# Patient Record
Sex: Female | Born: 1980
Health system: Southern US, Community
[De-identification: ages and names within clinical notes are randomized; demographics above are authoritative.]

## PROBLEM LIST (undated history)

## (undated) DIAGNOSIS — Z5189 Encounter for other specified aftercare: Secondary | ICD-10-CM

## (undated) DIAGNOSIS — F32A Depression, unspecified: Secondary | ICD-10-CM

## (undated) DIAGNOSIS — B1920 Unspecified viral hepatitis C without hepatic coma: Secondary | ICD-10-CM

## (undated) DIAGNOSIS — F419 Anxiety disorder, unspecified: Secondary | ICD-10-CM

## (undated) DIAGNOSIS — F1910 Other psychoactive substance abuse, uncomplicated: Secondary | ICD-10-CM

## (undated) HISTORY — PX: EYE SURGERY: SHX253

## (undated) HISTORY — PX: FRACTURE SURGERY: SHX138

## (undated) HISTORY — DX: Anxiety disorder, unspecified: F41.9

## (undated) HISTORY — DX: Depression, unspecified: F32.A

## (undated) HISTORY — PX: TUBAL LIGATION: SHX77

## (undated) HISTORY — DX: Encounter for other specified aftercare: Z51.89

---
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
## 2002-02-28 ENCOUNTER — Other Ambulatory Visit: Admission: RE | Admit: 2002-02-28 | Discharge: 2002-02-28 | Payer: Self-pay | Admitting: *Deleted

## 2002-03-22 ENCOUNTER — Inpatient Hospital Stay (HOSPITAL_COMMUNITY): Admission: AD | Admit: 2002-03-22 | Discharge: 2002-03-22 | Payer: Self-pay | Admitting: *Deleted

## 2002-04-01 ENCOUNTER — Inpatient Hospital Stay (HOSPITAL_COMMUNITY): Admission: AD | Admit: 2002-04-01 | Discharge: 2002-04-01 | Payer: Self-pay | Admitting: *Deleted

## 2002-05-17 ENCOUNTER — Inpatient Hospital Stay (HOSPITAL_COMMUNITY): Admission: AD | Admit: 2002-05-17 | Discharge: 2002-05-19 | Payer: Self-pay | Admitting: *Deleted

## 2002-05-17 ENCOUNTER — Encounter: Payer: Self-pay | Admitting: *Deleted

## 2002-05-24 ENCOUNTER — Encounter: Payer: Self-pay | Admitting: *Deleted

## 2002-05-24 ENCOUNTER — Encounter (HOSPITAL_COMMUNITY): Admission: RE | Admit: 2002-05-24 | Discharge: 2002-05-24 | Payer: Self-pay | Admitting: *Deleted

## 2002-05-27 ENCOUNTER — Inpatient Hospital Stay (HOSPITAL_COMMUNITY): Admission: AD | Admit: 2002-05-27 | Discharge: 2002-05-31 | Payer: Self-pay | Admitting: *Deleted

## 2002-11-26 ENCOUNTER — Emergency Department (HOSPITAL_COMMUNITY): Admission: EM | Admit: 2002-11-26 | Discharge: 2002-11-26 | Payer: Self-pay | Admitting: Emergency Medicine

## 2003-08-21 ENCOUNTER — Emergency Department (HOSPITAL_COMMUNITY): Admission: EM | Admit: 2003-08-21 | Discharge: 2003-08-21 | Payer: Self-pay | Admitting: Emergency Medicine

## 2005-10-05 ENCOUNTER — Emergency Department: Payer: Self-pay | Admitting: Unknown Physician Specialty

## 2005-10-05 ENCOUNTER — Emergency Department: Payer: Self-pay | Admitting: Emergency Medicine

## 2005-10-21 ENCOUNTER — Emergency Department (HOSPITAL_COMMUNITY): Admission: EM | Admit: 2005-10-21 | Discharge: 2005-10-21 | Payer: Self-pay | Admitting: Emergency Medicine

## 2006-03-07 ENCOUNTER — Emergency Department (HOSPITAL_COMMUNITY): Admission: EM | Admit: 2006-03-07 | Discharge: 2006-03-07 | Payer: Self-pay | Admitting: Emergency Medicine

## 2006-04-12 ENCOUNTER — Emergency Department (HOSPITAL_COMMUNITY): Admission: EM | Admit: 2006-04-12 | Discharge: 2006-04-13 | Payer: Self-pay | Admitting: Emergency Medicine

## 2006-05-16 ENCOUNTER — Emergency Department (HOSPITAL_COMMUNITY): Admission: EM | Admit: 2006-05-16 | Discharge: 2006-05-16 | Payer: Self-pay | Admitting: Emergency Medicine

## 2006-05-25 ENCOUNTER — Ambulatory Visit (HOSPITAL_COMMUNITY): Admission: RE | Admit: 2006-05-25 | Discharge: 2006-05-25 | Payer: Self-pay | Admitting: Family Medicine

## 2006-07-05 ENCOUNTER — Ambulatory Visit (HOSPITAL_COMMUNITY): Admission: RE | Admit: 2006-07-05 | Discharge: 2006-07-05 | Payer: Self-pay | Admitting: Obstetrics & Gynecology

## 2006-07-22 ENCOUNTER — Ambulatory Visit (HOSPITAL_COMMUNITY): Admission: RE | Admit: 2006-07-22 | Discharge: 2006-07-22 | Payer: Self-pay | Admitting: Gynecology

## 2006-10-02 ENCOUNTER — Emergency Department (HOSPITAL_COMMUNITY): Admission: EM | Admit: 2006-10-02 | Discharge: 2006-10-02 | Payer: Self-pay | Admitting: Emergency Medicine

## 2006-11-14 ENCOUNTER — Inpatient Hospital Stay (HOSPITAL_COMMUNITY): Admission: AD | Admit: 2006-11-14 | Discharge: 2006-11-14 | Payer: Self-pay | Admitting: Family Medicine

## 2006-11-30 ENCOUNTER — Inpatient Hospital Stay (HOSPITAL_COMMUNITY): Admission: AD | Admit: 2006-11-30 | Discharge: 2006-11-30 | Payer: Self-pay | Admitting: Obstetrics

## 2006-12-05 ENCOUNTER — Inpatient Hospital Stay (HOSPITAL_COMMUNITY): Admission: RE | Admit: 2006-12-05 | Discharge: 2006-12-08 | Payer: Self-pay | Admitting: Obstetrics

## 2007-04-02 ENCOUNTER — Ambulatory Visit (HOSPITAL_COMMUNITY): Admission: AD | Admit: 2007-04-02 | Discharge: 2007-04-02 | Payer: Self-pay | Admitting: Obstetrics

## 2007-04-02 ENCOUNTER — Encounter (INDEPENDENT_AMBULATORY_CARE_PROVIDER_SITE_OTHER): Payer: Self-pay | Admitting: Obstetrics

## 2007-05-30 ENCOUNTER — Other Ambulatory Visit: Admission: RE | Admit: 2007-05-30 | Discharge: 2007-05-30 | Payer: Self-pay | Admitting: Unknown Physician Specialty

## 2007-05-30 ENCOUNTER — Encounter (INDEPENDENT_AMBULATORY_CARE_PROVIDER_SITE_OTHER): Payer: Self-pay | Admitting: Unknown Physician Specialty

## 2010-07-14 NOTE — Op Note (Signed)
NAMEERIEL, DUNCKEL NO.:  [id number]   MEDICAL RECORD NO.:  [id number]          PATIENT TYPE:  AMB   LOCATION:  SDC                           FACILITY:  [facility]   PHYSICIAN:  Kathreen Cosier, M.D.DATE OF BIRTH:  01-Jan-1981   DATE OF PROCEDURE:  04/02/2007  DATE OF DISCHARGE:                               OPERATIVE REPORT   PREOPERATIVE DIAGNOSIS:  Spontaneous incomplete abortion.   POSTOPERATIVE DIAGNOSIS:  Spontaneous incomplete abortion.   SURGEON:  Kathreen Cosier, M.D.   DESCRIPTION OF PROCEDURE:  Using MAC, patient in lithotomy position,  perineum and vagina were prepped and draped and bladder emptied with a  straight catheter.  Bimanual exam revealed uterus to be 8 weeks' size.  Speculum was placed in the vagina.  Cervix was injected with 10 mL of 1%  Xylocaine.  The cervix was noted to be open and easily admitted a #29  Pratt.  A #9 suction was used to aspirate the uterine contents until the  cavity was clean.  The patient tolerated the procedure well and taken to  recovery room in good condition.           ______________________________  Kathreen Cosier, M.D.     BAM/MEDQ  D:  04/02/2007  T:  04/03/2007  Job:  [id number]

## 2010-07-17 NOTE — Discharge Summary (Signed)
   NAME:  Abigail Rosario, Abigail Rosario                       ACCOUNT NO.:  [id number]   MEDICAL RECORD NO.:  [id number]                   PATIENT TYPE:  INP   LOCATION:  9163                                 FACILITY:  [facility]   PHYSICIAN:  Georgina Peer, M.D.              DATE OF BIRTH:  [birth year]   DATE OF ADMISSION:  05/17/2002  DATE OF DISCHARGE:  05/19/2002                                 DISCHARGE SUMMARY   ADMISSION DIAGNOSES:  Pregnancy at 12 and 3/7 weeks with mild  oligohydramnios and decreased fetal movement.   DISCHARGE DIAGNOSES:  1. Pregnancy 40 and 5/7 weeks.  2. Reassuring fetal heart rate tracing.  3. Failed induction.   BRIEF HISTORY:  This is a 30 year old gravida 1, para 0, EDC May 14, 2002  presented at 40 and 3/7 weeks for evaluation of decreased fetal movement for  two days.  The patient had a BPP of 8/8, but an AFI of 8.8 which was  slightly decreased and she was brought in for attempted induction.  The  ultrasound showed appropriate for gestational age and she underwent Cytotec  cervical ripening.  At that point her cervix was 1, 3 cm long, and -2  station and she was contracting every three to four minutes.  Pitocin was  continued through the day on March 19 but it was discontinued at  approximately 2000 hours with the cervix unchanged.  She was given two more  doses of Cytotec through the night and on March 20 a full day of Pitocin.  At 1800 the patient was feeling only mild contractions with regularity.  The  fetal heart rate was reassuring without evidence of decelerations.  The  patient was given the option to continue with a third day of induction or to  go home because the fetal heart rate tracing had been reactive for two days.  The patient chose to go home and was discharged in good condition.  She will  follow up in the office in two to three days.  She will note any decreased  fetal movement, ruptured membranes, or labor.                   Georgina Peer, M.D.    JPN/MEDQ  D:  05/28/2002  T:  05/29/2002  Job:  [id number]

## 2010-11-20 LAB — POCT PREGNANCY, URINE
Operator id: 263291
Preg Test, Ur: POSITIVE

## 2010-11-20 LAB — TYPE AND SCREEN: Antibody Screen: NEGATIVE

## 2010-11-20 LAB — CBC
Hemoglobin: 12.6
MCHC: 34.8
MCV: 80.3
Platelets: 190
RBC: 4.51
RDW: 15.8 — ABNORMAL HIGH
WBC: 7.6

## 2010-12-10 LAB — CBC
HCT: 35.8 — ABNORMAL LOW
Hemoglobin: 12.6
MCV: 87.1
RBC: 3.18 — ABNORMAL LOW
RBC: 4.11
WBC: 8.4

## 2010-12-10 LAB — URINALYSIS, ROUTINE W REFLEX MICROSCOPIC
Bilirubin Urine: NEGATIVE
Bilirubin Urine: NEGATIVE
Ketones, ur: NEGATIVE
Nitrite: NEGATIVE
Protein, ur: NEGATIVE
Protein, ur: 30 — AB
Specific Gravity, Urine: 1.02
Specific Gravity, Urine: 1.01
Urobilinogen, UA: 0.2
Urobilinogen, UA: 0.2
pH: 7

## 2010-12-10 LAB — URINE MICROSCOPIC-ADD ON

## 2017-02-07 DIAGNOSIS — R748 Abnormal levels of other serum enzymes: Secondary | ICD-10-CM | POA: Insufficient documentation

## 2017-02-07 DIAGNOSIS — R17 Unspecified jaundice: Secondary | ICD-10-CM | POA: Insufficient documentation

## 2017-02-07 DIAGNOSIS — R8290 Unspecified abnormal findings in urine: Secondary | ICD-10-CM | POA: Insufficient documentation

## 2017-02-07 DIAGNOSIS — F32A Depression, unspecified: Secondary | ICD-10-CM | POA: Insufficient documentation

## 2017-02-07 DIAGNOSIS — R7402 Elevation of levels of lactic acid dehydrogenase (LDH): Secondary | ICD-10-CM | POA: Insufficient documentation

## 2017-02-07 DIAGNOSIS — R1013 Epigastric pain: Secondary | ICD-10-CM | POA: Insufficient documentation

## 2017-02-07 DIAGNOSIS — K810 Acute cholecystitis: Secondary | ICD-10-CM | POA: Insufficient documentation

## 2017-02-07 DIAGNOSIS — K8000 Calculus of gallbladder with acute cholecystitis without obstruction: Secondary | ICD-10-CM | POA: Insufficient documentation

## 2017-02-10 DIAGNOSIS — R7881 Bacteremia: Secondary | ICD-10-CM | POA: Insufficient documentation

## 2020-01-08 ENCOUNTER — Emergency Department (HOSPITAL_COMMUNITY): Payer: Self-pay

## 2020-01-08 ENCOUNTER — Emergency Department (HOSPITAL_COMMUNITY)
Admission: EM | Admit: 2020-01-08 | Discharge: 2020-01-08 | Disposition: A | Discharge status: Other Acute Inpatient Hospital | Payer: Self-pay | Attending: Emergency Medicine | Admitting: Emergency Medicine

## 2020-01-08 ENCOUNTER — Encounter (HOSPITAL_COMMUNITY): Payer: Self-pay | Admitting: *Deleted

## 2020-01-08 DIAGNOSIS — Z046 Encounter for general psychiatric examination, requested by authority: Secondary | ICD-10-CM | POA: Insufficient documentation

## 2020-01-08 DIAGNOSIS — Z20822 Contact with and (suspected) exposure to covid-19: Secondary | ICD-10-CM | POA: Insufficient documentation

## 2020-01-08 DIAGNOSIS — F17200 Nicotine dependence, unspecified, uncomplicated: Secondary | ICD-10-CM | POA: Insufficient documentation

## 2020-01-08 DIAGNOSIS — W260XXA Contact with knife, initial encounter: Secondary | ICD-10-CM | POA: Insufficient documentation

## 2020-01-08 DIAGNOSIS — Z23 Encounter for immunization: Secondary | ICD-10-CM | POA: Insufficient documentation

## 2020-01-08 DIAGNOSIS — S41112A Laceration without foreign body of left upper arm, initial encounter: Secondary | ICD-10-CM | POA: Insufficient documentation

## 2020-01-08 HISTORY — DX: Other psychoactive substance abuse, uncomplicated: F19.10

## 2020-01-08 HISTORY — DX: Unspecified viral hepatitis C without hepatic coma: B19.20

## 2020-01-08 LAB — COMPREHENSIVE METABOLIC PANEL
ALT: 17 U/L (ref 0–44)
AST: 21 U/L (ref 15–41)
Albumin: 4.6 g/dL (ref 3.5–5.0)
Alkaline Phosphatase: 68 U/L (ref 38–126)
Anion gap: 8 (ref 5–15)
BUN: 10 mg/dL (ref 6–20)
CO2: 24 mmol/L (ref 22–32)
Calcium: 9.2 mg/dL (ref 8.9–10.3)
Chloride: 105 mmol/L (ref 98–111)
Creatinine, Ser: 0.69 mg/dL (ref 0.44–1.00)
GFR, Estimated: >60 mL/min (ref >60)
Glucose, Bld: 95 mg/dL (ref 70–99)
Potassium: 3.3 mmol/L — ABNORMAL LOW (ref 3.5–5.1)
Sodium: 137 mmol/L (ref 135–145)
Total Bilirubin: 0.6 mg/dL (ref 0.3–1.2)
Total Protein: 7.6 g/dL (ref 6.5–8.1)

## 2020-01-08 LAB — RAPID URINE DRUG SCREEN, HOSP PERFORMED
Amphetamines: POSITIVE — AB
Barbiturates: NOT DETECTED
Benzodiazepines: NOT DETECTED
Cocaine: NOT DETECTED
Opiates: NOT DETECTED
Tetrahydrocannabinol: POSITIVE — AB

## 2020-01-08 LAB — RESPIRATORY PANEL BY RT PCR (FLU A&B, COVID)
Influenza A by PCR: NEGATIVE
Influenza B by PCR: NEGATIVE
SARS Coronavirus 2 by RT PCR: NEGATIVE

## 2020-01-08 LAB — CBC
HCT: 43 % (ref 36.0–46.0)
Hemoglobin: 13.7 g/dL (ref 12.0–15.0)
MCH: 26.4 pg (ref 26.0–34.0)
MCHC: 31.9 g/dL (ref 30.0–36.0)
MCV: 82.9 fL (ref 80.0–100.0)
Platelets: 242 10*3/uL (ref 150–400)
RBC: 5.19 MIL/uL — ABNORMAL HIGH (ref 3.87–5.11)
RDW: 14.1 % (ref 11.5–15.5)
WBC: 8.6 10*3/uL (ref 4.0–10.5)
nRBC: 0 % (ref 0.0–0.2)

## 2020-01-08 LAB — ETHANOL: Alcohol, Ethyl (B): <10 mg/dL (ref <10)

## 2020-01-08 LAB — I-STAT BETA HCG BLOOD, ED (MC, WL, AP ONLY): I-stat hCG, quantitative: <5 m[IU]/mL (ref <5)

## 2020-01-08 LAB — ACETAMINOPHEN LEVEL: Acetaminophen (Tylenol), Serum: <10 ug/mL — ABNORMAL LOW (ref 10–30)

## 2020-01-08 LAB — SALICYLATE LEVEL: Salicylate Lvl: <7 mg/dL — ABNORMAL LOW (ref 7.0–30.0)

## 2020-01-08 MED ORDER — NICOTINE 21 MG/24HR TD PT24
21.0000 mg | MEDICATED_PATCH | Freq: Every day | TRANSDERMAL | Status: DC
  Administered 2020-01-08: 21 mg via TRANSDERMAL
  Filled 2020-01-08: qty 1

## 2020-01-08 MED ORDER — POTASSIUM CHLORIDE CRYS ER 20 MEQ PO TBCR: 40.0000 meq | EXTENDED_RELEASE_TABLET | Freq: Once | ORAL | Status: DC

## 2020-01-08 MED ORDER — TETANUS-DIPHTH-ACELL PERTUSSIS 5-2.5-18.5 LF-MCG/0.5 IM SUSY
0.5000 mL | PREFILLED_SYRINGE | Freq: Once | INTRAMUSCULAR | Status: AC
  Administered 2020-01-08: 0.5 mL via INTRAMUSCULAR
  Filled 2020-01-08: qty 0.5

## 2020-01-08 MED ORDER — ACETAMINOPHEN 500 MG PO TABS
1000.0000 mg | ORAL_TABLET | Freq: Once | ORAL | Status: AC
  Administered 2020-01-08: 1000 mg via ORAL
  Filled 2020-01-08: qty 2

## 2020-01-08 MED ORDER — ONDANSETRON 4 MG PO TBDP
4.0000 mg | ORAL_TABLET | Freq: Once | ORAL | Status: AC
  Administered 2020-01-08: 4 mg via ORAL
  Filled 2020-01-08: qty 1

## 2020-01-08 NOTE — ED Provider Notes (Signed)
[facility]-EMERGENCY DEPT Provider Note   CSN: [id number] Arrival date & time: 01/08/20  1432     History Chief Complaint  Patient presents with  . Medical Clearance    Abigail RuddyJennifer Rosario is a 39 y.o. female with a past medical history of drug abuse including methamphetamines, hepatitis C, who presents today for evaluation of reported self-inflicted laceration.  History obtained from patient and triage notes. According to triage note patient was [facility] by [facility] stating that she cut her self with a razor.  She reports meth and cannabis use.  She does not know when her last tetanus shot was.  She states that she feels hopeless.  She states that she has issues with her relationship and depression however not elaborate.  At first she tells me she does not know how she got cut.  When I continue to ask her she tells me "I don't know, it was a mirror or something I broke at home and snagged my arm on it."  She reports that she thinks abut SI however "wouldn't act on it."    She denies HI or AVH.   HPI     Past Medical History:  Diagnosis Date  . Drug abuse (HCC)   . Hepatitis C     There are no problems to display for this patient.   History reviewed. No pertinent surgical history.   OB History   No obstetric history on file.     No family history on file.  Social History   Tobacco Use  . Smoking status: Current Every Day Smoker  . Smokeless tobacco: Never Used  Substance Use Topics  . Alcohol use: Not on file  . Drug use: Yes    Types: Methamphetamines    Home Medications Prior to Admission medications   Medication Sig Start Date End Date Taking? Authorizing Provider  ibuprofen (ADVIL) 200 MG tablet Take 600 mg by mouth every 6 (six) hours as needed for fever, headache or mild pain.   Yes [provider]  lidocaine (LIDODERM) 5 % Place 1 patch onto the skin daily as needed (pain). Remove  & Discard patch within 12 hours or as directed by MD   Yes [provider]    Allergies    Septra [sulfamethoxazole-trimethoprim]  Review of Systems   Review of Systems  Unable to perform ROS: Psychiatric disorder   Patient is choosing not to answer many direct questions.  Physical Exam Updated Vital Signs BP 108/60 (BP Location: Right Arm)   Pulse 85   Temp (!) 97.5 F (36.4 C) (Oral)   Resp 17   SpO2 100%   Physical Exam Vitals and nursing note reviewed.  Constitutional:      Appearance: She is well-developed. She is not diaphoretic.     Comments: Disheveled, tearful  HENT:     Head: Normocephalic and atraumatic.  Eyes:     General: No scleral icterus.       Right eye: No discharge.        Left eye: No discharge.     Conjunctiva/sclera: Conjunctivae normal.  Cardiovascular:     Rate and Rhythm: Normal rate and regular rhythm.     Pulses: Normal pulses.  Pulmonary:     Effort: Pulmonary effort is normal. No respiratory distress.     Breath sounds: Normal breath sounds. No stridor.  Abdominal:     General: There is no distension.  Tenderness: There is no abdominal tenderness.  Musculoskeletal:        General: No deformity.     Cervical back: Normal range of motion.     Comments: Full AROM of left arm and hand.   Skin:    General: Skin is warm and dry.     Comments: Please see clinical image.  There is a 5cm laceration across the left anterior superior forearm across the tattoo of name.  Wound is partially through the dermis without significant subcutaneous involvement.   Neurological:     General: No focal deficit present.     Mental Status: She is alert and oriented to person, place, and time.     Sensory: No sensory deficit (Sensation intact to light touch to left arm/hand).     Motor: No abnormal muscle tone.  Psychiatric:        Attention and Perception: She is inattentive.        Mood and Affect: Affect is labile and tearful.        Speech:  Speech is tangential.        Behavior: Behavior is uncooperative, agitated and withdrawn.        Thought Content: Thought content does not include homicidal ideation. Thought content does not include homicidal plan.        Judgment: Judgment is impulsive.     Comments: Does not appear to be responding to internal stimuli.         ED Results / Procedures / Treatments   Labs (all labs ordered are listed, but only abnormal results are displayed) Labs Reviewed  COMPREHENSIVE METABOLIC PANEL - Abnormal; Notable for the following components:      Result Value   Potassium 3.3 (*)    All other components within normal limits  SALICYLATE LEVEL - Abnormal; Notable for the following components:   Salicylate Lvl <7.0 (*)    All other components within normal limits  ACETAMINOPHEN LEVEL - Abnormal; Notable for the following components:   Acetaminophen (Tylenol), Serum <10 (*)    All other components within normal limits  CBC - Abnormal; Notable for the following components:   RBC 5.19 (*)    All other components within normal limits  RAPID URINE DRUG SCREEN, HOSP PERFORMED - Abnormal; Notable for the following components:   Amphetamines POSITIVE (*)    Tetrahydrocannabinol POSITIVE (*)    All other components within normal limits  RESPIRATORY PANEL BY RT PCR (FLU A&B, COVID)  ETHANOL  I-STAT BETA HCG BLOOD, ED ([facility], [facility], [facility] ONLY)    EKG None  Radiology DG Chest Portable 1 View  Result Date: 01/08/2020 CLINICAL DATA:  Cough EXAM: PORTABLE CHEST 1 VIEW COMPARISON:  08/14/2011 FINDINGS: The heart size and mediastinal contours are within normal limits. Both lungs are clear. The visualized skeletal structures are unremarkable. IMPRESSION: No active disease. Electronically Signed   By: Helyn Numbers MD   On: 01/08/2020 16:21    Procedures .Marland KitchenLaceration Repair  Date/Time: 01/08/2020 10:33 PM Performed by: Cristina Gong, PA-C Authorized by: Cristina Gong, PA-C   Consent:     Consent obtained:  Verbal (Patient IVC)   Consent given by:  Patient   Risks discussed:  Infection, need for additional repair, poor cosmetic result, pain, retained foreign body, tendon damage, vascular damage, poor wound healing and nerve damage   Alternatives discussed:  No treatment and referral (Alternative wound closures) Anesthesia (see MAR for exact dosages):    Anesthesia method:  None Laceration  details:    Length (cm):  5 Repair type:    Repair type:  Simple Exploration:    Hemostasis achieved with:  Direct pressure   Wound exploration: wound explored through full range of motion and entire depth of wound probed and visualized     Wound extent: no muscle damage noted and no vascular damage noted   Treatment:    Irrigation method:  Pressure wash Skin repair:    Repair method:  Steri-Strips and tissue adhesive   Number of Steri-Strips:  3 Approximation:    Approximation:  Close Post-procedure details:    Dressing:  Non-adherent dressing   Patient tolerance of procedure:  Tolerated well, no immediate complications Comments:     Patient adamantly refused sutures stating that she did not want stitches, she wanted to be either glue or a butterfly bandage.    (including critical care time)  Medications Ordered in ED Medications  nicotine (NICODERM CQ - dosed in mg/24 hours) patch 21 mg (21 mg Transdermal Patch Applied 01/08/20 1707)  Tdap (BOOSTRIX) injection 0.5 mL (0.5 mLs Intramuscular Given 01/08/20 1707)  ondansetron (ZOFRAN-ODT) disintegrating tablet 4 mg (4 mg Oral Given 01/08/20 2200)  acetaminophen (TYLENOL) tablet 1,000 mg (1,000 mg Oral Given 01/08/20 2200)    ED Course  I have reviewed the triage vital signs and the nursing notes.  Pertinent labs & imaging results that were available during my care of the patient were reviewed by me and considered in my medical decision making (see chart for details).  Clinical Course as of Jan 07 2234  Tue Jan 08, 2020   1527 Informed by RN that patient is refusing blood draws, not being cooperative, will not leave door open to allow for adequate safety supervision. Will attempt IVC papers.     [EH]  1622 IVC papers signed by Dr. Freida Busman, given to secretary.    [EH]    Clinical Course User Index [EH] Norman Clay   MDM Rules/Calculators/A&P                         Patient is a 39 year old woman who presents today for evaluation of an arm laceration.  According to triage note patient had a self-inflicted laceration on the arm.  Initially patient tells me she does not know how she got the wound however then tells me she caught it on a broken mirror.  And concern for a self-inflicted laceration especially as the laceration runs through a tattoo she has of a person's name.  Patient would not answer all questions directly limiting ability to evaluate her and was uncooperative while in the emergency room.  Due to this and concern for self-inflicted laceration IVC paperwork is completed as patient appears to be a threat to herself.  Labs are obtained and reviewed, acetaminophen, salicylate and ethanol are undetected.  Covid is negative.  CBC and CMP without significant acute abnormalities.  UDS is positive for amphetamines and THC, both of which patient admitted to using during our discussion.  She refused sutures, stating that she only wanted the wound closed with adhesive or bandages.  Given that the wound is superficial and does not gape significantly I feel this is appropriate.  Wound is closed and approximated with Steri-Strips and tissue adhesive after irrigation.    Patient did report mild headache and is given Tylenol.  Her tetanus is updated.  She is given a nicotine patch.  TTS is consulted.  Patient is medically  clear for psychiatric evaluation and disposition.  Note: Portions of this report may have been transcribed using voice recognition software. Every effort was made to ensure accuracy;  however, inadvertent computerized transcription errors may be present  Final Clinical Impression(s) / ED Diagnoses Final diagnoses:  Involuntary commitment  Laceration of left upper arm without complication, initial encounter    Rx / DC Orders ED Discharge Orders    None       Norman Clay 01/08/20 2240    Lorre Nick, MD 01/10/20 [phone]

## 2020-01-08 NOTE — ED Notes (Signed)
TTS called and states patient has been accepted at [facility] for room 304 Bed 2.   Call report to [phone]

## 2020-01-08 NOTE — ED Triage Notes (Addendum)
BIB EMS per [facility] boyfriend called stating she cut self with razor, approx 3 in cut to left forearm, bleeding controlled. Wants drug rehab ? meth use. Refused vitals with EMS. Pt not wanting to talk.   Pt states, "I just want to talk to somebody" later she begins to ramble, will not answer questions, "I want something to drink.  Difficult to complete triage due to rambling.
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
## 2020-01-08 NOTE — ED Provider Notes (Signed)
Medical screening examination/treatment/procedure(s) were conducted as a shared visit with non-physician practitioner(s) and myself.  I personally evaluated the patient during the encounter.    39 year old female who presents after self-inflicted wounds to her wrist.  History of substance abuse.  Not cooperative.  Placed under IVC for her safety.  Will consult TTS pending medical clearance   Lorre Nick, MD 01/08/20 1651

## 2020-01-08 NOTE — ED Notes (Signed)
Pt pulled tourniquet off while trying to get blood drawn.  Pt then stated after this incident that she didn't want her labs drawn.

## 2020-01-08 NOTE — ED Notes (Signed)
[facility] called for transportation to [facility]

## 2020-01-09 ENCOUNTER — Other Ambulatory Visit: Payer: Self-pay

## 2020-01-09 ENCOUNTER — Inpatient Hospital Stay (HOSPITAL_COMMUNITY)
Admission: AD | Admit: 2020-01-09 | Discharge: 2020-01-11 | DRG: 885 | Disposition: A | Discharge status: Home / Self Care | Payer: Federal, State, Local not specified - Other | Source: Intra-hospital | Attending: Psychiatry | Admitting: Psychiatry

## 2020-01-09 ENCOUNTER — Encounter (HOSPITAL_COMMUNITY): Payer: Self-pay | Admitting: Psychiatry

## 2020-01-09 DIAGNOSIS — F17210 Nicotine dependence, cigarettes, uncomplicated: Secondary | ICD-10-CM | POA: Diagnosis present

## 2020-01-09 DIAGNOSIS — Z9152 Personal history of nonsuicidal self-harm: Secondary | ICD-10-CM | POA: Diagnosis not present

## 2020-01-09 DIAGNOSIS — Z818 Family history of other mental and behavioral disorders: Secondary | ICD-10-CM

## 2020-01-09 DIAGNOSIS — F419 Anxiety disorder, unspecified: Secondary | ICD-10-CM | POA: Diagnosis present

## 2020-01-09 DIAGNOSIS — F322 Major depressive disorder, single episode, severe without psychotic features: Secondary | ICD-10-CM | POA: Diagnosis present

## 2020-01-09 DIAGNOSIS — F329 Major depressive disorder, single episode, unspecified: Secondary | ICD-10-CM | POA: Diagnosis present

## 2020-01-09 DIAGNOSIS — F1990 Other psychoactive substance use, unspecified, uncomplicated: Secondary | ICD-10-CM

## 2020-01-09 DIAGNOSIS — F332 Major depressive disorder, recurrent severe without psychotic features: Secondary | ICD-10-CM

## 2020-01-09 MED ORDER — ACETAMINOPHEN 325 MG PO TABS
650.0000 mg | ORAL_TABLET | Freq: Four times a day (QID) | ORAL | Status: DC | PRN
  Administered 2020-01-09: 650 mg via ORAL
  Filled 2020-01-09: qty 2

## 2020-01-09 MED ORDER — QUETIAPINE FUMARATE 50 MG PO TABS
50.0000 mg | ORAL_TABLET | Freq: Every day | ORAL | Status: DC
  Administered 2020-01-09 – 2020-01-10 (×2): 50 mg via ORAL
  Filled 2020-01-09 (×3): qty 1

## 2020-01-09 MED ORDER — GABAPENTIN 100 MG PO CAPS
200.0000 mg | ORAL_CAPSULE | Freq: Three times a day (TID) | ORAL | Status: DC
  Administered 2020-01-09 – 2020-01-11 (×5): 200 mg via ORAL
  Filled 2020-01-09 (×10): qty 2

## 2020-01-09 MED ORDER — ESCITALOPRAM OXALATE 10 MG PO TABS
10.0000 mg | ORAL_TABLET | Freq: Every day | ORAL | Status: DC
  Administered 2020-01-09 – 2020-01-11 (×3): 10 mg via ORAL
  Filled 2020-01-09 (×5): qty 1

## 2020-01-09 MED ORDER — MAGNESIUM HYDROXIDE 400 MG/5ML PO SUSP
30.0000 mL | Freq: Every day | ORAL | Status: DC | PRN
  Administered 2020-01-09: 30 mL via ORAL
  Filled 2020-01-09: qty 30

## 2020-01-09 MED ORDER — NICOTINE 21 MG/24HR TD PT24
21.0000 mg | MEDICATED_PATCH | Freq: Every day | TRANSDERMAL | Status: DC
  Administered 2020-01-09 – 2020-01-10 (×2): 21 mg via TRANSDERMAL
  Filled 2020-01-09 (×4): qty 1

## 2020-01-09 MED ORDER — WHITE PETROLATUM EX OINT
TOPICAL_OINTMENT | CUTANEOUS | Status: AC
  Administered 2020-01-09: 1
  Filled 2020-01-09: qty 5

## 2020-01-09 MED ORDER — TRAZODONE HCL 50 MG PO TABS: 50.0000 mg | ORAL_TABLET | Freq: Every evening | ORAL | Status: DC | PRN

## 2020-01-09 MED ORDER — HYDROXYZINE HCL 25 MG PO TABS
25.0000 mg | ORAL_TABLET | Freq: Three times a day (TID) | ORAL | Status: DC | PRN
  Administered 2020-01-09: 25 mg via ORAL
  Filled 2020-01-09: qty 1

## 2020-01-09 MED ORDER — ALUM & MAG HYDROXIDE-SIMETH 200-200-20 MG/5ML PO SUSP: 30.0000 mL | ORAL | Status: DC | PRN

## 2020-01-09 NOTE — BHH Counselor (Signed)
The focus of this group is to help patients establish daily goals to achieve during treatment and discuss how the patient can incorporate goal setting into their daily lives to aide in recovery.   Patient did not attend group. 

## 2020-01-09 NOTE — Tx Team (Signed)
Initial Treatment Plan 01/09/2020 3:54 AM [name] DYJ:[id number]    PATIENT STRESSORS: Marital or family conflict Medication change or noncompliance   PATIENT STRENGTHS: General fund of knowledge Motivation for treatment/growth   PATIENT IDENTIFIED PROBLEMS: Risk for suicide  depression  anxiety  "anxiety, depression, be able to talk more"               DISCHARGE CRITERIA:  Improved stabilization in mood, thinking, and/or behavior Verbal commitment to aftercare and medication compliance  PRELIMINARY DISCHARGE PLAN: Attend PHP/IOP Outpatient therapy  PATIENT/FAMILY INVOLVEMENT: This treatment plan has been presented to and reviewed with the patient, Abigail Rosario.  The patient and family have been given the opportunity to ask questions and make suggestions.  Delos Haring, RN 01/09/2020, 3:54 AM

## 2020-01-09 NOTE — BHH Group Notes (Addendum)
BHH LCSW Group Therapy  01/09/2020 2:15 PM  Type of Therapy:  Gratitude and Thankfulness   Participation Level:  Minimal  Participation Quality:  Drowsy  Affect:  Depressed and Lethargic  Cognitive:  Appropriate  Insight:  Developing/Improving  Engagement in Therapy:  Developing/Improving  Modes of Intervention:  Activity and Discussion  Summary of Progress/Problems: Abigail Rosario attended group but came back early due to being tired and wanting to sleep.  While outside in group Abigail Rosario did not share what she is thankful for and sat in group alone.    Abigail Rosario 01/09/2020, 2:15 PM

## 2020-01-09 NOTE — BHH Suicide Risk Assessment (Signed)
[facility] Admission Suicide Risk Assessment   Nursing information obtained from:  Patient Demographic factors:  Low socioeconomic status Current Mental Status:  NA Loss Factors:  Financial problems / change in socioeconomic status Historical Factors:  Impulsivity Risk Reduction Factors:  Positive social support  Total Time spent with patient: 15 minutes Principal Problem: <principal problem not specified> Diagnosis:  Active Problems:   MDD (major depressive disorder)  Data: 39 year old female with history of depression and polysusbstance abuse presenting with SI  Continued Clinical Symptoms:  Alcohol Use Disorder Identification Test Final Score (AUDIT): 1 The "Alcohol Use Disorders Identification Test", Guidelines for Use in Primary Care, Second Edition.  World Science writer [facility]). Score between 0-7:  no or low risk or alcohol related problems. Score between 8-15:  moderate risk of alcohol related problems. Score between 16-19:  high risk of alcohol related problems. Score 20 or above:  warrants further diagnostic evaluation for alcohol dependence and treatment.   CLINICAL FACTORS:   Depression:   Impulsivity Alcohol/Substance Abuse/Dependencies  See HPI for Mental status exam  COGNITIVE FEATURES THAT CONTRIBUTE TO RISK:  Closed-mindedness    SUICIDE RISK:   Mild:  Suicidal ideation of limited frequency, intensity, duration, and specificity.  There are no identifiable plans, no associated intent, mild dysphoria and related symptoms, good self-control (both objective and subjective assessment), few other risk factors, and identifiable protective factors, including available and accessible social support.  PLAN OF CARE: Continue care on inpatient basis  I certify that inpatient services furnished can reasonably be expected to improve the patient's condition.   Clement Sayres, MD 01/09/2020, 4:36 PM

## 2020-01-09 NOTE — Progress Notes (Signed)
D:  Patient's self inventory sheet, patient sleeps good, sleep medication helpful.  Poor appetite, low energy level, poor concentration.  Rated depression and anxiety 8, hopeless 5.  Withdrawals, sedation, chilling, cravings, agitation, nausea.  Physical problems, lightheaded, pain, heart, head, body pain#6,.  Goal is rest.  No plans to discharge at this time. A:  Medications administered per MD orders.  Emotional support and encouragement given patient. R:  Denied SI and HI, contracts for safety.  Denied A/V hallucinations.  Safety maintained with 15 minute checks.
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
## 2020-01-09 NOTE — H&P (Signed)
Psychiatric Admission Assessment Adult  Patient Identification: Abigail Rosario MRN:  [id number] Date of Evaluation:  01/09/2020 Chief Complaint:  MDD (major depressive disorder) [F32.9] Principal Diagnosis: <principal problem not specified> Diagnosis:  Active Problems:   MDD (major depressive disorder)  History of Present Illness: Abigail Rosario is a 39 y.o. female with a PMH of depression, anxiety, methamphetamine & cannabis use, and HCV. She presents to the [facility] following presentation to the [facility] for self-inflicted arm laceration. The patient was very guarded during interview, thus overall assessment is somewhat limited by this. She stated that she was arguing with her boyfriend Tommy when she cut her left forearm. The cut is located near a tattoo that reads "Tommy". The patient reports that drugs were involved to some extent during this argument. She reports that she does not feel safe going back to her boyfriend's and is not sure where she will eventually end up. Her mother has been contacted and knows she's here, and the patient gave consent to talk with her mother.   She tearfully states that she feels tired because she "doesn't sleep." She stated that the longest she's gone without sleep is 2-3 days. She further stated her appetite has been poor. She stated that she had several things she used to like to do, but did not elaborate, and states these interests have decreased for the past several months. She states she feels anxious, and does not want to be in this hospital as it "like a prison". She has tried Celexa in the past for mood, but did not elaborate much on its efficacy. She currently denies SI, HI, and AVH. She does not appear to be responding to internal stimuli.   Associated Signs/Symptoms: Depression Symptoms:  depressed mood, anhedonia, psychomotor agitation, fatigue, feelings of worthlessness/guilt, difficulty concentrating, impaired  memory, anxiety, loss of energy/fatigue, disturbed sleep, decreased appetite, Duration of Depression Symptoms: No data recorded (Hypo) Manic Symptoms:  Irritable Mood, Anxiety Symptoms:  Excessive Worry, Psychotic Symptoms:  None Duration of Psychotic Symptoms: No data recorded PTSD Symptoms: NA Total Time spent with patient: 30 minutes  Past Psychiatric History: Depression, anxiety   Is the patient at risk to self? Yes.    Has the patient been a risk to self in the past 6 months? Yes.    Has the patient been a risk to self within the distant past? Yes.    Is the patient a risk to others? No.  Has the patient been a risk to others in the past 6 months? No.  Has the patient been a risk to others within the distant past? No.   Prior Inpatient Therapy:   Prior Outpatient Therapy:    Alcohol Screening: 1. How often do you have a drink containing alcohol?: Never 2. How many drinks containing alcohol do you have on a typical day when you are drinking?: 3 or 4 3. How often do you have six or more drinks on one occasion?: Never AUDIT-C Score: 1 4. How often during the last year have you found that you were not able to stop drinking once you had started?: Never 5. How often during the last year have you failed to do what was normally expected from you because of drinking?: Never 6. How often during the last year have you needed a first drink in the morning to get yourself going after a heavy drinking session?: Never 7. How often during the last year have you had a feeling of guilt of remorse after drinking?:  Never 8. How often during the last year have you been unable to remember what happened the night before because you had been drinking?: Never 9. Have you or someone else been injured as a result of your drinking?: No 10. Has a relative or friend or a doctor or another health worker been concerned about your drinking or suggested you cut down?: No Alcohol Use Disorder Identification  Test Final Score (AUDIT): 1 Alcohol Brief Interventions/Follow-up: AUDIT Score <7 follow-up not indicated   Substance Abuse History in the last 12 months:  Yes.    Consequences of Substance Abuse: Withdrawal Symptoms:   anxiety, goosebumps, cold chills  Previous Psychotropic Medications: Yes  Psychological Evaluations: No  Past Medical History:  Past Medical History:  Diagnosis Date  . Drug abuse (HCC)   . Hepatitis C    History reviewed. No pertinent surgical history. Family History: History reviewed. No pertinent family history. Family Psychiatric  History: Depression, anxiety (maternal aunt and uncle)  Tobacco Screening: Have you used any form of tobacco in the last 30 days? (Cigarettes, Smokeless Tobacco, Cigars, and/or Pipes): Yes Tobacco use, Select all that apply: 5 or more cigarettes per day Are you interested in Tobacco Cessation Medications?: No, patient refused Counseled patient on smoking cessation including recognizing danger situations, developing coping skills and basic information about quitting provided: Refused/Declined practical counseling Social History:  Social History   Substance and Sexual Activity  Alcohol Use Yes     Social History   Substance and Sexual Activity  Drug Use Yes  . Types: Methamphetamines, Marijuana    Additional Social History: She reports living with her boyfriend. She did not appear to be safe in this environment. Her mother also lives locally. She has a history of polysubstance use, including methamphetamine and cannabis. She does not drink alcohol regularly.   Allergies:   Allergies  Allergen Reactions  . Septra [Sulfamethoxazole-Trimethoprim] Hives and Rash   Lab Results:  Results for orders placed or performed during the hospital encounter of 01/08/20 (from the past 48 hour(s))  Comprehensive metabolic panel     Status: Abnormal   Collection Time: 01/08/20  5:04 PM  Result Value Ref Range   Sodium 137 135 - 145 mmol/L    Potassium 3.3 (L) 3.5 - 5.1 mmol/L   Chloride 105 98 - 111 mmol/L   CO2 24 22 - 32 mmol/L   Glucose, Bld 95 70 - 99 mg/dL    Comment: Glucose reference range applies only to samples taken after fasting for at least 8 hours.   BUN 10 6 - 20 mg/dL   Creatinine, Ser 8.41 0.44 - 1.00 mg/dL   Calcium 9.2 8.9 - 32.4 mg/dL   Total Protein 7.6 6.5 - 8.1 g/dL   Albumin 4.6 3.5 - 5.0 g/dL   AST 21 15 - 41 U/L   ALT 17 0 - 44 U/L   Alkaline Phosphatase 68 38 - 126 U/L   Total Bilirubin 0.6 0.3 - 1.2 mg/dL   GFR, Estimated >40 >10 mL/min    Comment: (NOTE) Calculated using the CKD-EPI Creatinine Equation (2021)    Anion gap 8 5 - 15    Comment: Performed at [facility], [address], [place], [place] [address]  Ethanol     Status: None   Collection Time: 01/08/20  5:04 PM  Result Value Ref Range   Alcohol, Ethyl (B) <10 <10 mg/dL    Comment: (NOTE) Lowest detectable limit for serum alcohol is 10 mg/dL.  For medical purposes only. Performed at [facility], [address] [place], [place] [address]   Salicylate level     Status: Abnormal   Collection Time: 01/08/20  5:04 PM  Result Value Ref Range   Salicylate Lvl <7.0 (L) 7.0 - 30.0 mg/dL    Comment: Performed at [facility], [address], [place], [place] [address]  Acetaminophen level     Status: Abnormal   Collection Time: 01/08/20  5:04 PM  Result Value Ref Range   Acetaminophen (Tylenol), Serum <10 (L) 10 - 30 ug/mL    Comment: (NOTE) Therapeutic concentrations vary significantly. A range of 10-30 ug/mL  may be an effective concentration for many patients. However, some  are best treated at concentrations outside of this range. Acetaminophen concentrations >150 ug/mL at 4 hours after ingestion  and >50 ug/mL at 12 hours after ingestion are often associated with  toxic reactions.  Performed at [facility], [address], [place], [place]  [address]   cbc     Status: Abnormal   Collection Time: 01/08/20  5:04 PM  Result Value Ref Range   WBC 8.6 4.0 - 10.5 K/uL   RBC 5.19 (H) 3.87 - 5.11 MIL/uL   Hemoglobin 13.7 12.0 - 15.0 g/dL   HCT 75.1 36 - 46 %   MCV 82.9 80.0 - 100.0 fL   MCH 26.4 26.0 - 34.0 pg   MCHC 31.9 30.0 - 36.0 g/dL   RDW 02.5 85.2 - 77.8 %   Platelets 242 150 - 400 K/uL   nRBC 0.0 0.0 - 0.2 %    Comment: Performed at [facility], [address], [place], [place] [address]  Rapid urine drug screen (hospital performed)     Status: Abnormal   Collection Time: 01/08/20  5:04 PM  Result Value Ref Range   Opiates NONE DETECTED NONE DETECTED   Cocaine NONE DETECTED NONE DETECTED   Benzodiazepines NONE DETECTED NONE DETECTED   Amphetamines POSITIVE (A) NONE DETECTED   Tetrahydrocannabinol POSITIVE (A) NONE DETECTED   Barbiturates NONE DETECTED NONE DETECTED    Comment: (NOTE) DRUG SCREEN FOR MEDICAL PURPOSES ONLY.  IF CONFIRMATION IS NEEDED FOR ANY PURPOSE, NOTIFY LAB WITHIN 5 DAYS.  LOWEST DETECTABLE LIMITS FOR URINE DRUG SCREEN Drug Class                     Cutoff (ng/mL) Amphetamine and metabolites    1000 Barbiturate and metabolites    200 Benzodiazepine                 200 Tricyclics and metabolites     300 Opiates and metabolites        300 Cocaine and metabolites        300 THC                            50 Performed at [facility], [address] [place], [place] [address]   Respiratory Panel by RT PCR (Flu A&B, Covid) - Nasopharyngeal Swab     Status: None   Collection Time: 01/08/20  5:09 PM   Specimen: Nasopharyngeal Swab  Result Value Ref Range   SARS Coronavirus 2 by RT PCR NEGATIVE NEGATIVE    Comment: (NOTE) SARS-CoV-2 target nucleic acids are NOT DETECTED.  The SARS-CoV-2 RNA is generally detectable in upper respiratoy specimens during the acute phase of infection. The  lowest concentration of SARS-CoV-2 viral copies this assay can detect  is 131 copies/mL. A negative result does not preclude SARS-Cov-2 infection and should not be used as the sole basis for treatment or other patient management decisions. A negative result may occur with  improper specimen collection/handling, submission of specimen other than nasopharyngeal swab, presence of viral mutation(s) within the areas targeted by this assay, and inadequate number of viral copies (<131 copies/mL). A negative result must be combined with clinical observations, patient history, and epidemiological information. The expected result is Negative.  Fact Sheet for Patients:  https://www.moore.com/  Fact Sheet for Healthcare Providers:  https://www.young.biz/  This test is no t yet approved or cleared by the [place] FDA and  has been authorized for detection and/or diagnosis of SARS-CoV-2 by FDA under an Emergency Use Authorization (EUA). This EUA will remain  in effect (meaning this test can be used) for the duration of the COVID-19 declaration under Section 564(b)(1) of the Act, 21 U.S.C. section 360bbb-3(b)(1), unless the authorization is terminated or revoked sooner.     Influenza A by PCR NEGATIVE NEGATIVE   Influenza B by PCR NEGATIVE NEGATIVE    Comment: (NOTE) The Xpert Xpress SARS-CoV-2/FLU/RSV assay is intended as an aid in  the diagnosis of influenza from Nasopharyngeal swab specimens and  should not be used as a sole basis for treatment. Nasal washings and  aspirates are unacceptable for Xpert Xpress SARS-CoV-2/FLU/RSV  testing.  Fact Sheet for Patients: https://www.moore.com/  Fact Sheet for Healthcare Providers: https://www.young.biz/  This test is not yet approved or cleared by the [place] FDA and  has been authorized for detection and/or diagnosis of SARS-CoV-2 by  FDA under an Emergency Use Authorization (EUA). This EUA will remain  in effect (meaning this  test can be used) for the duration of the  Covid-19 declaration under Section 564(b)(1) of the Act, 21  U.S.C. section 360bbb-3(b)(1), unless the authorization is  terminated or revoked. Performed at [facility], [address], [place], [place] [address]   I-Stat beta hCG blood, ED     Status: None   Collection Time: 01/08/20  5:15 PM  Result Value Ref Range   I-stat hCG, quantitative <5.0 <5 mIU/mL   Comment 3            Comment:   GEST. AGE      CONC.  (mIU/mL)   <=1 WEEK        5 - 50     2 WEEKS       50 - 500     3 WEEKS       100 - 10,000     4 WEEKS     1,000 - 30,000        FEMALE AND NON-PREGNANT FEMALE:     LESS THAN 5 mIU/mL     Blood Alcohol level:  Lab Results  Component Value Date   ETH <10 01/08/2020    Metabolic Disorder Labs:  No results found for: HGBA1C, MPG No results found for: PROLACTIN No results found for: CHOL, TRIG, HDL, CHOLHDL, VLDL, LDLCALC  Current Medications: Current Facility-Administered Medications  Medication Dose Route Frequency Provider Last Rate Last Admin  . acetaminophen (TYLENOL) tablet 650 mg  650 mg Oral Q6H PRN Dixon, Rashaun M, NP      . alum & mag hydroxide-simeth (MAALOX/MYLANTA) 200-200-20 MG/5ML suspension 30 mL  30 mL Oral Q4H PRN Dixon, Elray Buba, NP      . hydrOXYzine (  ATARAX/VISTARIL) tablet 25 mg  25 mg Oral TID PRN Jearld Lesch, NP   25 mg at 01/09/20 0744  . magnesium hydroxide (MILK OF MAGNESIA) suspension 30 mL  30 mL Oral Daily PRN Dixon, Rashaun M, NP      . traZODone (DESYREL) tablet 50 mg  50 mg Oral QHS PRN Jearld Lesch, NP       PTA Medications: Medications Prior to Admission  Medication Sig Dispense Refill Last Dose  . ibuprofen (ADVIL) 200 MG tablet Take 600 mg by mouth every 6 (six) hours as needed for fever, headache or mild pain.     Marland Kitchen lidocaine (LIDODERM) 5 % Place 1 patch onto the skin daily as needed (pain). Remove & Discard patch within 12 hours or as directed by MD        Musculoskeletal: Strength & Muscle Tone: within normal limits Gait & Station: normal Patient leans: N/A  Psychiatric Specialty Exam: Physical Exam  Review of Systems  Blood pressure (!) 89/59, pulse 91, temperature (!) 97.5 F (36.4 C), temperature source Oral, resp. rate 18, last menstrual period 12/12/2019, SpO2 99 %.There is no height or weight on file to calculate BMI.  General Appearance: Casual, Guarded and multiple tattoos, bandage over left forearm  Eye Contact:  Minimal  Speech:  Normal Rate  Volume:  Normal  Mood:  Angry, Anxious, Depressed and Irritable  Affect:  Constricted, Depressed and Tearful  Thought Process:  Coherent  Orientation:  Full (Time, Place, and Person)  Thought Content:  WDL  Suicidal Thoughts:  No  Homicidal Thoughts:  No  Memory:  Immediate;   Fair Recent;   Fair Remote;   Fair  Judgement:  Poor  Insight:  Lacking  Psychomotor Activity:  Restlessness  Concentration:  Concentration: Fair  Recall:  [employer] of Knowledge:  Fair  Language:  Fair  Akathisia:  NA  Handed:  Right  AIMS (if indicated):     Assets:  Leisure Time Resilience Talents/Skills  ADL's:  Intact  Cognition:  WNL  Sleep:       Treatment Plan Summary: Daily contact with patient to assess and evaluate symptoms and progress in treatment and Medication management  Observation Level/Precautions:  Continuous Observation  Laboratory:  Routine labs collected in ED   Psychotherapy:    Medications:    Consultations:    Discharge Concerns:    Estimated LOS:  Other:     Physician Treatment Plan for Primary Diagnosis: MDD (major depressive disorder)  Long Term Goal(s): Improvement in symptoms so as ready for discharge  Short Term Goals: Ability to identify changes in lifestyle to reduce recurrence of condition will improve, Ability to disclose and discuss suicidal ideas, Ability to identify and develop effective coping behaviors will improve and Ability to identify  triggers associated with substance abuse/mental health issues will improve   Problem #1: Major Depressive Disorder vs. 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 At this time, she does not appear to be safe for discharge as she recently tried to cut herself. Her mood instability is most concerning for primary mood disorder vs. substance-induced affective disorder. Precipitating factors for MDD include social instability and lack of support, substance use, and potential intimate partner violence. However, she was only minimally participatory during assessment so full details unclear. Additionally, methamphetamine withdrawal is known to cause anhedonia, apathy, and suicidal ideation, and given her history of drug use this  may very well be playing a role in her presentation. Her mental status exam is significant for a depressed, irritable, and tearful affect, which is consistent with the above. The plan is as follows:   - Will ultimately need abstinence from inciting substance to expect mood improvement  - Will work with social work on plan for dispo as it may be unsafe for patient to return to partner's home  - Seroquel 50 mg PO QHS for anxiety, mood, and sleep  - Gabapentin 200 mg PO TID for anxiety  - Lexapro 10 mg PO daily for mood   I certify that inpatient services furnished can reasonably be expected to improve the patient's condition.    Luciano CutterJoseph E Locklear, 3rd Year Medical Student 11/10/202110:18 AM

## 2020-01-09 NOTE — Progress Notes (Signed)
Patient ID: Abigail Rosario, female   DOB: 1980-11-16, 39 y.o.   MRN: [id number]  Admission Note:  39 yr female who presents IVC in no acute distress for the treatment of SI and Depression. Pt appears flat and depressed, but disorganized and confused at times. Pt appeared to be a poor historian.   Per Assessment: 39 y.o.femalewith a past medical history of drug abuse including methamphetamines, hepatitis C, who presents today for evaluation of reported self-inflicted laceration. History obtained from patient and triage notes. According to triage note patient was brought in by EMS, according to [facility] patients boyfriend called 9-11 stating that she cut her self with a razor. She reports meth and cannabis use. She does not know when her last tetanus shot was. She states that she feels hopeless. She states that she has issues with her relationship and depression however not elaborate. At first she tells me she does not know how she got cut.  Per IVC: Per triage note patients boyfriend called as reports patient cut her left arm with a razor this morning, cut through a tattoo of a persons name. Patient tells me she doesn't know how she cut her arm admits to meth use. She refuses to answer some questions limiting ability to assess her. She refuses to cooperate with safety precautions in the ED.   Skin was assessed Linton Rump RN) and found to be clear of any abnormal marks apart from a laceration L-forearm, multiple tattoos.. PT searched and no contraband found, POC and unit policies explained and understanding verbalized. Consents obtained. Food and fluids offered, and fluids accepted.   R: Pt had no additional questions or concerns.

## 2020-01-09 NOTE — Progress Notes (Signed)
Recreation Therapy Notes  Date:  11.10.21 Time: 0930 Location: [address] Dayroom  Group Topic: Stress Management  Goal Area(s) Addresses:  Patient will identify positive stress management techniques. Patient will identify benefits of using stress management post d/c.  Intervention: Stress Management  Activity : Meditation.  LRT read a script that focused on enjoying the peaceful waves at the beach.  Patients were to listen and follow as script was read to fully engage in activity.  Education:  Stress Management, Discharge Planning.   Education Outcome: Acknowledges Education  Clinical Observations/Feedback: Pt did not attend activity.    Ashling Roane, LRT/CTRS        Lydia Meng A 01/09/2020 11:14 AM 

## 2020-01-09 NOTE — Plan of Care (Addendum)
Nurse discussed anxiety, depression and coping skills with patient.  

## 2020-01-09 NOTE — Tx Team (Signed)
Interdisciplinary Treatment and Diagnostic Plan Update  01/09/2020 Time of Session: 9:20am  Abigail Rosario MRN: [id number]  Principal Diagnosis: <principal problem not specified>  Secondary Diagnoses: Active Problems:   MDD (major depressive disorder)   Current Medications:  Current Facility-Administered Medications  Medication Dose Route Frequency Provider Last Rate Last Admin  . acetaminophen (TYLENOL) tablet 650 mg  650 mg Oral Q6H PRN Dixon, Rashaun M, NP      . alum & mag hydroxide-simeth (MAALOX/MYLANTA) 200-200-20 MG/5ML suspension 30 mL  30 mL Oral Q4H PRN Dixon, Rashaun M, NP      . hydrOXYzine (ATARAX/VISTARIL) tablet 25 mg  25 mg Oral TID PRN Deloria Lair, NP   25 mg at 01/09/20 0744  . magnesium hydroxide (MILK OF MAGNESIA) suspension 30 mL  30 mL Oral Daily PRN Dixon, Rashaun M, NP      . traZODone (DESYREL) tablet 50 mg  50 mg Oral QHS PRN Deloria Lair, NP       PTA Medications: Medications Prior to Admission  Medication Sig Dispense Refill Last Dose  . ibuprofen (ADVIL) 200 MG tablet Take 600 mg by mouth every 6 (six) hours as needed for fever, headache or mild pain.     Marland Kitchen lidocaine (LIDODERM) 5 % Place 1 patch onto the skin daily as needed (pain). Remove & Discard patch within 12 hours or as directed by MD       Patient Stressors: Marital or family conflict Medication change or noncompliance  Patient Strengths: Technical sales engineer for treatment/growth  Treatment Modalities: Medication Management, Group therapy, Case management,  1 to 1 session with clinician, Psychoeducation, Recreational therapy.   Physician Treatment Plan for Primary Diagnosis: <principal problem not specified> Long Term Goal(s):     Short Term Goals:    Medication Management: Evaluate patient's response, side effects, and tolerance of medication regimen.  Therapeutic Interventions: 1 to 1 sessions, Unit Group sessions and Medication  administration.  Evaluation of Outcomes: Not Met  Physician Treatment Plan for Secondary Diagnosis: Active Problems:   MDD (major depressive disorder)  Long Term Goal(s):     Short Term Goals:       Medication Management: Evaluate patient's response, side effects, and tolerance of medication regimen.  Therapeutic Interventions: 1 to 1 sessions, Unit Group sessions and Medication administration.  Evaluation of Outcomes: Not Met   RN Treatment Plan for Primary Diagnosis: <principal problem not specified> Long Term Goal(s): Knowledge of disease and therapeutic regimen to maintain health will improve  Short Term Goals: Ability to remain free from injury will improve, Ability to participate in decision making will improve, Ability to verbalize feelings will improve, Ability to disclose and discuss suicidal ideas and Ability to identify and develop effective coping behaviors will improve  Medication Management: RN will administer medications as ordered by provider, will assess and evaluate patient's response and provide education to patient for prescribed medication. RN will report any adverse and/or side effects to prescribing provider.  Therapeutic Interventions: 1 on 1 counseling sessions, Psychoeducation, Medication administration, Evaluate responses to treatment, Monitor vital signs and CBGs as ordered, Perform/monitor CIWA, COWS, AIMS and Fall Risk screenings as ordered, Perform wound care treatments as ordered.  Evaluation of Outcomes: Not Met   LCSW Treatment Plan for Primary Diagnosis: <principal problem not specified> Long Term Goal(s): Safe transition to appropriate next level of care at discharge, Engage patient in therapeutic group addressing interpersonal concerns.  Short Term Goals: Engage patient in aftercare planning with referrals and resources,  Increase social support, Increase ability to appropriately verbalize feelings, Facilitate acceptance of mental health diagnosis  and concerns, Identify triggers associated with mental health/substance abuse issues and Increase skills for wellness and recovery  Therapeutic Interventions: Assess for all discharge needs, 1 to 1 time with Social worker, Explore available resources and support systems, Assess for adequacy in community support network, Educate family and significant other(s) on suicide prevention, Complete Psychosocial Assessment, Interpersonal group therapy.  Evaluation of Outcomes: Not Met   Progress in Treatment: Attending groups: No. Participating in groups: No. Taking medication as prescribed: Yes. Toleration medication: Yes. Family/Significant other contact made: No, will contact:  If consents are given  Patient understands diagnosis: No. Discussing patient identified problems/goals with staff: Yes. Medical problems stabilized or resolved: Yes. Denies suicidal/homicidal ideation: Yes. Issues/concerns per patient self-inventory: No.   New problem(s) identified: No, Describe:  None   New Short Term/Long Term Goal(s): medication stabilization, elimination of SI thoughts, development of comprehensive mental wellness plan.   Patient Goals:  "To get some rest"   Discharge Plan or Barriers: Patient recently admitted. CSW will continue to follow and assess for appropriate referrals and possible discharge planning.   Reason for Continuation of Hospitalization: Depression Medication stabilization Suicidal ideation  Estimated Length of Stay: 3 to 5 days   Attendees: Patient: Abigail Rosario  01/09/2020   Physician: Lala Lund, MD 01/09/2020   Nursing:  01/09/2020   RN Care Manager: 01/09/2020   Social Worker: Verdis Frederickson, Ray 01/09/2020   Recreational Therapist:  01/09/2020   Other:  01/09/2020   Other:  01/09/2020   Other: 01/09/2020      Scribe for Treatment Team: Darleen Crocker, [place] 01/09/2020 9:56 AM
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
## 2020-01-10 DIAGNOSIS — F1990 Other psychoactive substance use, unspecified, uncomplicated: Secondary | ICD-10-CM

## 2020-01-10 LAB — URINALYSIS, ROUTINE W REFLEX MICROSCOPIC
Bacteria, UA: NONE SEEN
Bilirubin Urine: NEGATIVE
Glucose, UA: NEGATIVE mg/dL
Hgb urine dipstick: NEGATIVE
Ketones, ur: NEGATIVE mg/dL
Nitrite: NEGATIVE
Protein, ur: NEGATIVE mg/dL
Specific Gravity, Urine: 1.017 (ref 1.005–1.030)
pH: 7 (ref 5.0–8.0)

## 2020-01-10 MED ORDER — FLUCONAZOLE 150 MG PO TABS
150.0000 mg | ORAL_TABLET | Freq: Once | ORAL | Status: AC
  Administered 2020-01-10: 150 mg via ORAL
  Filled 2020-01-10: qty 1

## 2020-01-10 NOTE — Progress Notes (Signed)
DAR NOTE: Patient presents with anxious affect and depressed mood.  Denies suicidal thoughts, auditory and visual hallucinations.  Rates depression at 8, hopelessness at 8, and anxiety at 7.  Reports withdrawal symptoms on self inventory form.  Steristrip to left forearm intact.  No s/s of infection noted.  Maintained on routine safety checks.  Medications given as prescribed.  Support and encouragement offered as needed.  Attended group and participated.  States goal for today is "quit thinking so much."  Patient visible in milieu with minimal interaction with staff.  Patient is safe on the unit.

## 2020-01-10 NOTE — [facility] Group Notes (Signed)
The focus of this group is to help patients establish daily goals to achieve during treatment and discuss how the patient can incorporate goal setting into their daily lives to aide in recovery.   Patient attended group and was apprehensive but did contribute to group.

## 2020-01-10 NOTE — Progress Notes (Signed)
[facility] MD Progress Note  01/10/2020 10:58 AM Abigail Rosario  MRN:  [id number]   Daily Note: Chart reviewed, patient was seen, and plan was discussed with team. Patient reports that her sleep is good, stating "I needed it." She also reports appetite is good. She was previously reluctant to participate in group, but was more open today. On mental status exam, she continues to appear depressed. However, she did ask about resources and expressed positive outlook and desire to get help. She is tolerating medications well. She denies SI, HI, and AVH.   Principal Problem: <principal problem not specified> Diagnosis: Active Problems:   MDD (major depressive disorder)  Total Time spent with patient: 15 minutes  Past Psychiatric History: see H&P  Past Medical History:  Past Medical History:  Diagnosis Date  . Drug abuse (HCC)   . Hepatitis C    History reviewed. No pertinent surgical history. Family History: History reviewed. No pertinent family history. Family Psychiatric  History: see H&P Social History:  Social History   Substance and Sexual Activity  Alcohol Use Yes     Social History   Substance and Sexual Activity  Drug Use Yes  . Types: Methamphetamines, Marijuana    Social History   Socioeconomic History  . Marital status: Legally Separated    Spouse name: Not on file  . Number of children: Not on file  . Years of education: Not on file  . Highest education level: Not on file  Occupational History  . Not on file  Tobacco Use  . Smoking status: Current Every Day Smoker    Packs/day: 1.00    Years: 10.00    Pack years: 10.00  . Smokeless tobacco: Never Used  Vaping Use  . Vaping Use: Never used  Substance and Sexual Activity  . Alcohol use: Yes  . Drug use: Yes    Types: Methamphetamines, Marijuana  . Sexual activity: Not Currently  Other Topics Concern  . Not on file  Social History Narrative  . Not on file   Social Determinants of Health   Financial  Resource Strain:   . Difficulty of Paying Living Expenses: Not on file  Food Insecurity:   . Worried About Programme researcher, broadcasting/film/video in the Last Year: Not on file  . Ran Out of Food in the Last Year: Not on file  Transportation Needs:   . Lack of Transportation (Medical): Not on file  . Lack of Transportation (Non-Medical): Not on file  Physical Activity:   . Days of Exercise per Week: Not on file  . Minutes of Exercise per Session: Not on file  Stress:   . Feeling of Stress : Not on file  Social Connections:   . Frequency of Communication with Friends and Family: Not on file  . Frequency of Social Gatherings with Friends and Family: Not on file  . Attends Religious Services: Not on file  . Active Member of Clubs or Organizations: Not on file  . Attends Banker Meetings: Not on file  . Marital Status: Not on file   Additional Social History:                         Sleep: Fair  Appetite:  Fair  Current Medications: Current Facility-Administered Medications  Medication Dose Route Frequency Provider Last Rate Last Admin  . acetaminophen (TYLENOL) tablet 650 mg  650 mg Oral Q6H PRN Jearld Lesch, NP   650 mg at 01/09/20 2117  .  alum & mag hydroxide-simeth (MAALOX/MYLANTA) 200-200-20 MG/5ML suspension 30 mL  30 mL Oral Q4H PRN Dixon, Rashaun M, NP      . escitalopram (LEXAPRO) tablet 10 mg  10 mg Oral Daily Maurisa Tesmer, Worthy Rancher, MD   10 mg at 01/10/20 0829  . gabapentin (NEURONTIN) capsule 200 mg  200 mg Oral TID Clement Sayres, MD   200 mg at 01/10/20 0829  . hydrOXYzine (ATARAX/VISTARIL) tablet 25 mg  25 mg Oral TID PRN Jearld Lesch, NP   25 mg at 01/09/20 0744  . magnesium hydroxide (MILK OF MAGNESIA) suspension 30 mL  30 mL Oral Daily PRN Jearld Lesch, NP   30 mL at 01/09/20 1148  . nicotine (NICODERM CQ - dosed in mg/24 hours) patch 21 mg  21 mg Transdermal Daily Antonieta Pert, MD   21 mg at 01/10/20 0830  . QUEtiapine (SEROQUEL) tablet 50 mg   50 mg Oral QHS Miaisabella Bacorn, Worthy Rancher, MD   50 mg at 01/09/20 2117  . traZODone (DESYREL) tablet 50 mg  50 mg Oral QHS PRN Jearld Lesch, NP        Lab Results:  Results for orders placed or performed during the hospital encounter of 01/08/20 (from the past 48 hour(s))  Comprehensive metabolic panel     Status: Abnormal   Collection Time: 01/08/20  5:04 PM  Result Value Ref Range   Sodium 137 135 - 145 mmol/L   Potassium 3.3 (L) 3.5 - 5.1 mmol/L   Chloride 105 98 - 111 mmol/L   CO2 24 22 - 32 mmol/L   Glucose, Bld 95 70 - 99 mg/dL    Comment: Glucose reference range applies only to samples taken after fasting for at least 8 hours.   BUN 10 6 - 20 mg/dL   Creatinine, Ser 6.83 0.44 - 1.00 mg/dL   Calcium 9.2 8.9 - 41.9 mg/dL   Total Protein 7.6 6.5 - 8.1 g/dL   Albumin 4.6 3.5 - 5.0 g/dL   AST 21 15 - 41 U/L   ALT 17 0 - 44 U/L   Alkaline Phosphatase 68 38 - 126 U/L   Total Bilirubin 0.6 0.3 - 1.2 mg/dL   GFR, Estimated >62 >22 mL/min    Comment: (NOTE) Calculated using the CKD-EPI Creatinine Equation (2021)    Anion gap 8 5 - 15    Comment: Performed at [facility], [address], [place], [place] [address]  Ethanol     Status: None   Collection Time: 01/08/20  5:04 PM  Result Value Ref Range   Alcohol, Ethyl (B) <10 <10 mg/dL    Comment: (NOTE) Lowest detectable limit for serum alcohol is 10 mg/dL.  For medical purposes only. Performed at [facility], [address], [place], [place] [address]   Salicylate level     Status: Abnormal   Collection Time: 01/08/20  5:04 PM  Result Value Ref Range   Salicylate Lvl <7.0 (L) 7.0 - 30.0 mg/dL    Comment: Performed at [facility], [address], [place], [place] [address]  Acetaminophen level     Status: Abnormal   Collection Time: 01/08/20  5:04 PM  Result Value Ref Range   Acetaminophen (Tylenol), Serum <10 (L) 10 - 30 ug/mL    Comment: (NOTE) Therapeutic  concentrations vary significantly. A range of 10-30 ug/mL  may be an effective concentration for many patients. However, some  are best treated at concentrations outside  of this range. Acetaminophen concentrations >150 ug/mL at 4 hours after ingestion  and >50 ug/mL at 12 hours after ingestion are often associated with  toxic reactions.  Performed at [facility], [address] [place], [place] [address]   cbc     Status: Abnormal   Collection Time: 01/08/20  5:04 PM  Result Value Ref Range   WBC 8.6 4.0 - 10.5 K/uL   RBC 5.19 (H) 3.87 - 5.11 MIL/uL   Hemoglobin 13.7 12.0 - 15.0 g/dL   HCT 91.443.0 36 - 46 %   MCV 82.9 80.0 - 100.0 fL   MCH 26.4 26.0 - 34.0 pg   MCHC 31.9 30.0 - 36.0 g/dL   RDW 78.214.1 95.611.5 - 21.315.5 %   Platelets 242 150 - 400 K/uL   nRBC 0.0 0.0 - 0.2 %    Comment: Performed at [facility], [address] [place], [place] [address]  Rapid urine drug screen (hospital performed)     Status: Abnormal   Collection Time: 01/08/20  5:04 PM  Result Value Ref Range   Opiates NONE DETECTED NONE DETECTED   Cocaine NONE DETECTED NONE DETECTED   Benzodiazepines NONE DETECTED NONE DETECTED   Amphetamines POSITIVE (A) NONE DETECTED   Tetrahydrocannabinol POSITIVE (A) NONE DETECTED   Barbiturates NONE DETECTED NONE DETECTED    Comment: (NOTE) DRUG SCREEN FOR MEDICAL PURPOSES ONLY.  IF CONFIRMATION IS NEEDED FOR ANY PURPOSE, NOTIFY LAB WITHIN 5 DAYS.  LOWEST DETECTABLE LIMITS FOR URINE DRUG SCREEN Drug Class                     Cutoff (ng/mL) Amphetamine and metabolites    1000 Barbiturate and metabolites    200 Benzodiazepine                 200 Tricyclics and metabolites     300 Opiates and metabolites        300 Cocaine and metabolites        300 THC                            50 Performed at [facility], [address] [place], [place] [address]   Respiratory Panel by RT PCR (Flu A&B, Covid) -  Nasopharyngeal Swab     Status: None   Collection Time: 01/08/20  5:09 PM   Specimen: Nasopharyngeal Swab  Result Value Ref Range   SARS Coronavirus 2 by RT PCR NEGATIVE NEGATIVE    Comment: (NOTE) SARS-CoV-2 target nucleic acids are NOT DETECTED.  The SARS-CoV-2 RNA is generally detectable in upper respiratoy specimens during the acute phase of infection. The lowest concentration of SARS-CoV-2 viral copies this assay can detect is 131 copies/mL. A negative result does not preclude SARS-Cov-2 infection and should not be used as the sole basis for treatment or other patient management decisions. A negative result may occur with  improper specimen collection/handling, submission of specimen other than nasopharyngeal swab, presence of viral mutation(s) within the areas targeted by this assay, and inadequate number of viral copies (<131 copies/mL). A negative result must be combined with clinical observations, patient history, and epidemiological information. The expected result is Negative.  Fact Sheet for Patients:  https://www.moore.com/https://www.fda.gov/media/142436/download  Fact Sheet for Healthcare Providers:  https://www.young.biz/https://www.fda.gov/media/142435/download  This test is no t yet approved or cleared by the [place] FDA and  has been authorized for detection and/or diagnosis of  SARS-CoV-2 by FDA under an Emergency Use Authorization (EUA). This EUA will remain  in effect (meaning this test can be used) for the duration of the COVID-19 declaration under Section 564(b)(1) of the Act, 21 U.S.C. section 360bbb-3(b)(1), unless the authorization is terminated or revoked sooner.     Influenza A by PCR NEGATIVE NEGATIVE   Influenza B by PCR NEGATIVE NEGATIVE    Comment: (NOTE) The Xpert Xpress SARS-CoV-2/FLU/RSV assay is intended as an aid in  the diagnosis of influenza from Nasopharyngeal swab specimens and  should not be used as a sole basis for treatment. Nasal washings and  aspirates are  unacceptable for Xpert Xpress SARS-CoV-2/FLU/RSV  testing.  Fact Sheet for Patients: https://www.moore.com/  Fact Sheet for Healthcare Providers: https://www.young.biz/  This test is not yet approved or cleared by the [place] FDA and  has been authorized for detection and/or diagnosis of SARS-CoV-2 by  FDA under an Emergency Use Authorization (EUA). This EUA will remain  in effect (meaning this test can be used) for the duration of the  Covid-19 declaration under Section 564(b)(1) of the Act, 21  U.S.C. section 360bbb-3(b)(1), unless the authorization is  terminated or revoked. Performed at [facility], [address], [place], [place] [address]   I-Stat beta hCG blood, ED     Status: None   Collection Time: 01/08/20  5:15 PM  Result Value Ref Range   I-stat hCG, quantitative <5.0 <5 mIU/mL   Comment 3            Comment:   GEST. AGE      CONC.  (mIU/mL)   <=1 WEEK        5 - 50     2 WEEKS       50 - 500     3 WEEKS       100 - 10,000     4 WEEKS     1,000 - 30,000        FEMALE AND NON-PREGNANT FEMALE:     LESS THAN 5 mIU/mL     Blood Alcohol level:  Lab Results  Component Value Date   ETH <10 01/08/2020    Metabolic Disorder Labs: No results found for: HGBA1C, MPG No results found for: PROLACTIN No results found for: CHOL, TRIG, HDL, CHOLHDL, VLDL, LDLCALC  Physical Findings: AIMS: Facial and Oral Movements Muscles of Facial Expression: None, normal Lips and Perioral Area: None, normal Jaw: None, normal Tongue: None, normal,Extremity Movements Upper (arms, wrists, hands, fingers): None, normal Lower (legs, knees, ankles, toes): None, normal, Trunk Movements Neck, shoulders, hips: None, normal, Overall Severity Severity of abnormal movements (highest score from questions above): None, normal Incapacitation due to abnormal movements: None, normal Patient's awareness of abnormal movements (rate only  patient's report): No Awareness, Dental Status Current problems with teeth and/or dentures?: No Does patient usually wear dentures?: No  CIWA:  CIWA-Ar Total: 0 COWS:  COWS Total Score: 4  Musculoskeletal: Strength & Muscle Tone: within normal limits Gait & Station: normal Patient leans: N/A  Psychiatric Specialty Exam: Physical Exam Constitutional:      Appearance: Normal appearance.  HENT:     Head: Atraumatic.  Pulmonary:     Effort: Pulmonary effort is normal.  Neurological:     Mental Status: She is alert and oriented to person, place, and time.     Review of Systems  Psychiatric/Behavioral: Positive for decreased concentration and dysphoric mood. Negative for hallucinations and suicidal ideas. The patient is  nervous/anxious.     Blood pressure (!) 89/59, pulse 91, temperature (!) 97.5 F (36.4 C), temperature source Oral, resp. rate 18, last menstrual period 12/12/2019, SpO2 99 %.There is no height or weight on file to calculate BMI.  General Appearance: Casual  Eye Contact:  Minimal  Speech:  Clear and Coherent and Slow  Volume:  Decreased  Mood:  Dysphoric and Hopeless  Affect:  Congruent and Depressed  Thought Process:  Coherent and Goal Directed  Orientation:  Full (Time, Place, and Person)  Thought Content:  Logical  Suicidal Thoughts:  No  Homicidal Thoughts:  No  Memory:  Immediate;   Good Recent;   Fair Remote;   Fair  Judgement:  Poor  Insight:  Shallow  Psychomotor Activity:  Normal  Concentration:  Concentration: Fair and Attention Span: Fair  Recall:  [employer] of Knowledge:  Fair  Language:  Good  Akathisia:  No  Handed:  Right  AIMS (if indicated):     Assets:  Communication Skills Desire for Improvement Resilience Talents/Skills Vocational/Educational  ADL's:  Intact  Cognition:  WNL  Sleep:  Number of Hours: 6.75     Treatment Plan Summary: Daily contact with patient to assess and evaluate symptoms and progress in treatment and  Medication management   Major Depressive Disorder vs. 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 She states that her mood continues to be hopeless and she appears to be depressed in terms of her affect. However, she is less irritable today and is more participatory during assessment and group sessions. Also, she is receptive to getting resources provided by social work services, including establishing outpatient appointments for further long term management of her mood and substance use disorder. The plan is as follows:   - Will ultimately need abstinence from inciting substance to expect mood improvement  - Will work with social work on plan for dispo and outpatient follow up; this is somewhat complicated by the patient's legal circumstances  - Seroquel 50 mg PO QHS for anxiety, mood, and sleep  - Gabapentin 200 mg PO TID for anxiety  - Lexapro 10 mg PO daily for mood   Luciano Cutter, 3rd Year Medical Student 01/10/2020, 10:58 AM

## 2020-01-11 ENCOUNTER — Other Ambulatory Visit (HOSPITAL_COMMUNITY): Payer: Self-pay | Admitting: Psychiatry

## 2020-01-11 MED ORDER — GABAPENTIN 100 MG PO CAPS: 200.0000 mg | ORAL_CAPSULE | Freq: Three times a day (TID) | ORAL | 0 refills | Status: DC

## 2020-01-11 MED ORDER — QUETIAPINE FUMARATE 50 MG PO TABS: 50.0000 mg | ORAL_TABLET | Freq: Every day | ORAL | 0 refills | Status: DC

## 2020-01-11 MED ORDER — ESCITALOPRAM OXALATE 10 MG PO TABS: 10.0000 mg | ORAL_TABLET | Freq: Every day | ORAL | 0 refills | Status: DC

## 2020-01-11 MED FILL — QUETIAPINE FUMARATE 50 MG T: 50 | 30 days supply | Qty: 30 | Fill #0

## 2020-01-11 MED FILL — GABAPENTIN 100 MG CAPSULE: 100 | 30 days supply | Qty: 180 | Fill #0

## 2020-01-11 MED FILL — ESCITALOPRAM 10 MG TABLET: 10 | 30 days supply | Qty: 30 | Fill #0

## 2020-01-11 NOTE — Discharge Summary (Signed)
Physician Discharge Summary Note  Patient:  Abigail Rosario is an 39 y.o., female MRN:  [id number] DOB:  1980/03/31 Patient phone:  [phone] (home)  Patient address:   [address] [place] [address],  Total Time spent with patient: 20 minutes  Date of Admission:  01/09/2020 Date of Discharge: 01/11/2020  Reason for Admission: History of Present Illness: Abigail Rosario is a 39 y.o. female with a PMH of depression, anxiety, methamphetamine & cannabis use, and HCV. She presents to the [facility] following presentation to the [facility] for self-inflicted arm laceration. The patient was very guarded during interview, thus overall assessment is somewhat limited by this. She stated that she was arguing with her boyfriend Abigail Rosario when she cut her left forearm. The cut is located near a tattoo that reads "Abigail Rosario". The patient reports that drugs were involved to some extent during this argument. She reports that she does not feel safe going back to her boyfriend's and is not sure where she will eventually end up. Her mother has been contacted and knows she's here, and the patient gave consent to talk with her mother.   She tearfully states that she feels tired because she "doesn't sleep." She stated that the longest she's gone without sleep is 2-3 days. She further stated her appetite has been poor. She stated that she had several things she used to like to do, but did not elaborate, and states these interests have decreased for the past several months. She states she feels anxious, and does not want to be in this hospital as it "like a prison". She has tried Celexa in the past for mood, but did not elaborate much on its efficacy. She currently denies SI, HI, and AVH. She does not appear to be responding to internal stimuli.   Associated Signs/Symptoms: Depression Symptoms:  depressed mood, anhedonia, psychomotor agitation, fatigue, feelings of worthlessness/guilt, difficulty  concentrating, impaired memory, anxiety, loss of energy/fatigue, disturbed sleep, decreased appetite, Duration of Depression Symptoms: No data recorded (Hypo) Manic Symptoms:  Irritable Mood, Anxiety Symptoms:  Excessive Worry, Psychotic Symptoms:  None Duration of Psychotic Symptoms: No data recorded PTSD Symptoms: NA Total Time spent with patient: 30 minutes  Past Psychiatric History: Depression, anxiety   Principal Problem: <principal problem not specified> Discharge Diagnoses: Active Problems:   MDD (major depressive disorder)   Substance use disorder    Past Medical History:  Past Medical History:  Diagnosis Date  . Drug abuse (HCC)   . Hepatitis C    History reviewed. No pertinent surgical history. Family History: History reviewed. No pertinent family history.  Social History:  Social History   Substance and Sexual Activity  Alcohol Use Yes     Social History   Substance and Sexual Activity  Drug Use Yes  . Types: Methamphetamines, Marijuana    Social History   Socioeconomic History  . Marital status: Legally Separated    Spouse name: Not on file  . Number of children: Not on file  . Years of education: Not on file  . Highest education level: Not on file  Occupational History  . Not on file  Tobacco Use  . Smoking status: Current Every Day Smoker    Packs/day: 1.00    Years: 10.00    Pack years: 10.00  . Smokeless tobacco: Never Used  Vaping Use  . Vaping Use: Never used  Substance and Sexual Activity  . Alcohol use: Yes  . Drug use: Yes    Types: Methamphetamines, Marijuana  .  Sexual activity: Not Currently  Other Topics Concern  . Not on file  Social History Narrative  . Not on file   Social Determinants of Health   Financial Resource Strain:   . Difficulty of Paying Living Expenses: Not on file  Food Insecurity:   . Worried About Programme researcher, broadcasting/film/video in the Last Year: Not on file  . Ran Out of Food in the Last Year: Not on file   Transportation Needs:   . Lack of Transportation (Medical): Not on file  . Lack of Transportation (Non-Medical): Not on file  Physical Activity:   . Days of Exercise per Week: Not on file  . Minutes of Exercise per Session: Not on file  Stress:   . Feeling of Stress : Not on file  Social Connections:   . Frequency of Communication with Friends and Family: Not on file  . Frequency of Social Gatherings with Friends and Family: Not on file  . Attends Religious Services: Not on file  . Active Member of Clubs or Organizations: Not on file  . Attends Banker Meetings: Not on file  . Marital Status: Not on file    Hospital Course:      Abigail Rosario was admitted to the adult 300 unit. She was evaluated and his symptoms were identified. Medication management was discussed and initiated.She was oriented to the unit and encouraged to participate in unit programming. Medical problems were identified and treated appropriately. Home medication was restarted as needed.        The patient was evaluated each day by a clinical provider to ascertain the patient's response to treatment.  Improvement was noted by the patient's report of decreasing symptoms, improved sleep and appetite, affect, medication tolerance, behavior, and participation in unit programming.  He was asked each day to complete a self inventory noting mood, mental status, pain, new symptoms, anxiety and concerns.         She responded well to medication and being in a therapeutic and supportive environment. Positive and appropriate behavior was noted and the patient was motivated for recovery.  The patient worked closely with the treatment team and case manager to develop a discharge plan with appropriate goals. Coping skills, problem solving as well as relaxation therapies were also part of the unit programming.         By the day of discharge he was in much improved condition than upon admission.  Symptoms were reported as  significantly decreased or resolved completely. The patient denied SI/HI and voiced no AVH. She was motivated to continue taking medication with a goal of continued improvement in mental health.         Abigail Rosario was discharged home with a plan to follow up as noted below.    Prescriptions given at discharge.Patient agreeable to plan.  Given opportunity to ask questions. Appears to feel comfortable with discharge denies any current suicidal or homicidal thought. Patient is also instructed prior to discharge to: Take all medications as prescribed by her mental healthcare provider. Report any adverse effects and or reactions from the medicines to her outpatient provider promptly. Patient has been instructed & cautioned: To not engage in alcohol and or illegal drug use while on prescription medicines. In the event of worsening symptoms, patient is instructed to call the crisis hotline, 911 and or go to the nearest ED for appropriate evaluation and treatment of symptoms. To follow-up with her primary care provider for your other medical issues, concerns and  or health care needs    Physical Findings: AIMS: Facial and Oral Movements Muscles of Facial Expression: None, normal Lips and Perioral Area: None, normal Jaw: None, normal Tongue: None, normal,Extremity Movements Upper (arms, wrists, hands, fingers): None, normal Lower (legs, knees, ankles, toes): None, normal, Trunk Movements Neck, shoulders, hips: None, normal, Overall Severity Severity of abnormal movements (highest score from questions above): None, normal Incapacitation due to abnormal movements: None, normal Patient's awareness of abnormal movements (rate only patient's report): No Awareness, Dental Status Current problems with teeth and/or dentures?: No Does patient usually wear dentures?: No  CIWA:  CIWA-Ar Total: 0 COWS:  COWS Total Score: 4  Musculoskeletal: Strength & Muscle Tone: within normal limits Gait & Station:  normal Patient leans: N/A  Psychiatric Specialty Exam: Physical Exam  Review of Systems  Blood pressure 104/77, pulse (!) 121, temperature 97.7 F (36.5 C), temperature source Oral, resp. rate 18, last menstrual period 12/12/2019, SpO2 99 %.There is no height or weight on file to calculate BMI.  General Appearance: Casual  Eye Contact:  Good  Speech:  Normal Rate  Volume:  Normal  Mood:  Euthymic  Affect:  Congruent  Thought Process:  Coherent  Orientation:  Full (Time, Place, and Person)  Thought Content:  Logical  Suicidal Thoughts:  No  Homicidal Thoughts:  No  Memory:  Recent;   Fair  Judgement:  Fair  Insight:  Fair  Psychomotor Activity:  Normal  Concentration:  Attention Span: Fair  Recall:  [employer] of Knowledge:  Fair  Language:  Fair  Akathisia:  No  Handed:  Right  AIMS (if indicated):     Assets:  Desire for Improvement Housing Intimacy Leisure Time Resilience  ADL's:  Intact  Cognition:  WNL  Sleep:  Number of Hours: 6.75     Have you used any form of tobacco in the last 30 days? (Cigarettes, Smokeless Tobacco, Cigars, and/or Pipes): Yes  Has this patient used any form of tobacco in the last 30 days? (Cigarettes, Smokeless Tobacco, Cigars, and/or Pipes) Yes, No  Blood Alcohol level:  Lab Results  Component Value Date   ETH <10 01/08/2020    Metabolic Disorder Labs:  No results found for: HGBA1C, MPG No results found for: PROLACTIN No results found for: CHOL, TRIG, HDL, CHOLHDL, VLDL, LDLCALC  See Psychiatric Specialty Exam and Suicide Risk Assessment completed by Attending Physician prior to discharge.  Discharge destination:  Home  Is patient on multiple antipsychotic therapies at discharge:  No   Has Patient had three or more failed trials of antipsychotic monotherapy by history:  No  Recommended Plan for Multiple Antipsychotic Therapies: NA     Prescriptions given at discharge.Patient agreeable to plan.  Given opportunity to ask  questions. Appears to feel comfortable with discharge denies any current suicidal or homicidal thought. Patient is also instructed prior to discharge to: Take all medications as prescribed by her mental healthcare provider. Report any adverse effects and or reactions from the medicines to her outpatient provider promptly. Patient has been instructed & cautioned: To not engage in alcohol and or illegal drug use while on prescription medicines. In the event of worsening symptoms, patient is instructed to call the crisis hotline, 911 and or go to the nearest ED for appropriate evaluation and treatment of symptoms. To follow-up with her primary care provider for your other medical issues, concerns and or health care needs   States she is not  feeling suicidal and does not think he  needs to be here on the psychiatric unit.  He is agreeable to restart his medications and be observed for 24 hours plan to discharge tomorrow with outpatient follow-up.         was admitted to the adult 300 unit. He was evaluated and his symptoms were identified. Medication management was discussed and initiated. He was continued on lamictal 25 mg po bid for mood stabilization. He was oriented to the unit and encouraged to participate in unit programming. Medical problems were identified and treated appropriately. Home medication was restarted as needed.        The patient was evaluated each day by a clinical provider to ascertain the patient's response to treatment.  Improvement was noted by the patient's report of decreasing symptoms, improved sleep and appetite, affect, medication tolerance, behavior, and participation in unit programming.  He was asked each day to complete a self inventory noting mood, mental status, pain, new symptoms, anxiety and concerns.         He responded well to medication and being in a therapeutic and supportive environment. Positive and appropriate behavior was noted and the patient was motivated for  recovery.  The patient worked closely with the treatment team and case manager to develop a discharge plan with appropriate goals. Coping skills, problem solving as well as relaxation therapies were also part of the unit programming.         By the day of discharge he was in much improved condition than upon admission.  Symptoms were reported as significantly decreased or resolved completely. The patient denied SI/HI and voiced no AVH. He was motivated to continue taking medication with a goal of continued improvement in mental health.         She was discharged home with a plan to follow up as noted below.      Discharge Instructions     Diet - low sodium heart healthy   Complete by: As directed    Increase activity slowly   Complete by: As directed       Allergies as of 01/11/2020       Reactions   Septra [sulfamethoxazole-trimethoprim] Hives, Rash        Medication List     STOP taking these medications    lidocaine 5 % Commonly known as: LIDODERM       TAKE these medications      Indication  escitalopram 10 MG tablet Commonly known as: LEXAPRO Take 1 tablet (10 mg total) by mouth daily. Start taking on: January 12, 2020  Indication: Major Depressive Disorder   gabapentin 100 MG capsule Commonly known as: NEURONTIN Take 2 capsules (200 mg total) by mouth 3 (three) times daily.  Indication: Diabetes with Nerve Disease   ibuprofen 200 MG tablet Commonly known as: ADVIL Take 600 mg by mouth every 6 (six) hours as needed for fever, headache or mild pain.  Indication: Headache   QUEtiapine 50 MG tablet Commonly known as: SEROQUEL Take 1 tablet (50 mg total) by mouth at bedtime.  Indication: Major Depressive Disorder        Follow-up Information     [facility]. Go on 01/17/2020.   Specialty: Behavioral Health Why: You have a walk in appointment for therapy on 01/17/20 at 8:00 am.  You also have an appointment for  medication management on 02/06/20 at 9:30 am.  These appointments will be held in person. Contact information: [address]  [place] [phone]  Follow-up recommendations:  Other:  Follow-up with outpatient care    Signed: Clement Sayres, MD 01/11/2020, 11:34 AM

## 2020-01-11 NOTE — Progress Notes (Signed)
  [facility] Adult Case Management Discharge Plan :  Will you be returning to the same living situation after discharge:  Yes,  Home with boyfriend  At discharge, do you have transportation home?: Yes,  Boyfriend  Do you have the ability to pay for your medications: No.  Release of information consent forms completed and in the chart;  Patient's signature needed at discharge.  Patient to Follow up at:  Follow-up Information    [facility]. Go on 01/17/2020.   Specialty: Behavioral Health Why: You have a walk in appointment for therapy on 01/17/20 at 8:00 am.  You also have an appointment for medication management on 02/06/20 at 9:30 am.  These appointments will be held in person. Contact information: [address] [place] [address] [phone]              Next level of care provider has access to [facility] Link:yes  Safety Planning and Suicide Prevention discussed: Yes,  Mother and patient   Have you used any form of tobacco in the last 30 days? (Cigarettes, Smokeless Tobacco, Cigars, and/or Pipes): Yes  Has patient been referred to the Quitline?: Patient refused referral  Patient has been referred for addiction treatment: Pt. refused referral  Aram Beecham, LCSWA 01/11/2020, 9:15 AM

## 2020-01-11 NOTE — Plan of Care (Signed)
Discharge note  Patient verbalizes readiness for discharge. Follow up plan explained, AVS, Transition record and SRA given. Prescriptions and teaching provided. Belongings returned and signed for. Suicide safety plan completed and signed. Patient verbalizes understanding. Patient denies SI/HI and assures this Clinical research associate they will seek assistance should that change. Patient discharged to lobby to wait for their ride.  Problem: Education: Goal: Knowledge of [facility] Education information/materials will improve Outcome: Adequate for Discharge Goal: Emotional status will improve Outcome: Adequate for Discharge Goal: Mental status will improve Outcome: Adequate for Discharge Goal: Verbalization of understanding the information provided will improve Outcome: Adequate for Discharge   Problem: Activity: Goal: Interest or engagement in activities will improve Outcome: Adequate for Discharge Goal: Sleeping patterns will improve Outcome: Adequate for Discharge   Problem: Coping: Goal: Ability to verbalize frustrations and anger appropriately will improve Outcome: Adequate for Discharge Goal: Ability to demonstrate self-control will improve Outcome: Adequate for Discharge   Problem: Health Behavior/Discharge Planning: Goal: Identification of resources available to assist in meeting health care needs will improve Outcome: Adequate for Discharge Goal: Compliance with treatment plan for underlying cause of condition will improve Outcome: Adequate for Discharge   Problem: Physical Regulation: Goal: Ability to maintain clinical measurements within normal limits will improve Outcome: Adequate for Discharge   Problem: Safety: Goal: Periods of time without injury will increase Outcome: Adequate for Discharge   Problem: Education: Goal: Ability to make informed decisions regarding treatment will improve Outcome: Adequate for Discharge   Problem: Coping: Goal: Coping ability will  improve Outcome: Adequate for Discharge   Problem: Health Behavior/Discharge Planning: Goal: Identification of resources available to assist in meeting health care needs will improve Outcome: Adequate for Discharge   Problem: Medication: Goal: Compliance with prescribed medication regimen will improve Outcome: Adequate for Discharge   Problem: Self-Concept: Goal: Ability to disclose and discuss suicidal ideas will improve Outcome: Adequate for Discharge Goal: Will verbalize positive feelings about self Outcome: Adequate for Discharge   Problem: Activity: Goal: Will identify at least one activity in which they can participate Outcome: Adequate for Discharge   Problem: Coping: Goal: Ability to identify and develop effective coping behavior will improve Outcome: Adequate for Discharge Goal: Ability to interact with others will improve Outcome: Adequate for Discharge Goal: Demonstration of participation in decision-making regarding own care will improve Outcome: Adequate for Discharge Goal: Ability to use eye contact when communicating with others will improve Outcome: Adequate for Discharge   Problem: Health Behavior/Discharge Planning: Goal: Identification of resources available to assist in meeting health care needs will improve Outcome: Adequate for Discharge   Problem: Self-Concept: Goal: Will verbalize positive feelings about self Outcome: Adequate for Discharge   Problem: Education: Goal: Ability to state activities that reduce stress will improve Outcome: Adequate for Discharge   Problem: Coping: Goal: Ability to identify and develop effective coping behavior will improve Outcome: Adequate for Discharge   Problem: Self-Concept: Goal: Ability to identify factors that promote anxiety will improve Outcome: Adequate for Discharge Goal: Level of anxiety will decrease Outcome: Adequate for Discharge Goal: Ability to modify response to factors that promote anxiety  will improve Outcome: Adequate for Discharge   Problem: Education: Goal: Utilization of techniques to improve thought processes will improve Outcome: Adequate for Discharge Goal: Knowledge of the prescribed therapeutic regimen will improve Outcome: Adequate for Discharge   Problem: Activity: Goal: Interest or engagement in leisure activities will improve Outcome: Adequate for Discharge Goal: Imbalance in normal sleep/wake cycle will improve Outcome: Adequate for  Discharge   Problem: Coping: Goal: Coping ability will improve Outcome: Adequate for Discharge Goal: Will verbalize feelings Outcome: Adequate for Discharge   Problem: Health Behavior/Discharge Planning: Goal: Ability to make decisions will improve Outcome: Adequate for Discharge Goal: Compliance with therapeutic regimen will improve Outcome: Adequate for Discharge   Problem: Role Relationship: Goal: Will demonstrate positive changes in social behaviors and relationships Outcome: Adequate for Discharge   Problem: Safety: Goal: Ability to disclose and discuss suicidal ideas will improve Outcome: Adequate for Discharge Goal: Ability to identify and utilize support systems that promote safety will improve Outcome: Adequate for Discharge   Problem: Self-Concept: Goal: Will verbalize positive feelings about self Outcome: Adequate for Discharge Goal: Level of anxiety will decrease Outcome: Adequate for Discharge

## 2020-01-11 NOTE — BHH Suicide Risk Assessment (Signed)
[facility] Discharge Suicide Risk Assessment   Principal Problem: <principal problem not specified> Discharge Diagnoses: Active Problems:   MDD (major depressive disorder)   Substance use disorder   Total Time spent with patient: 15 minutes  Musculoskeletal: Strength & Muscle Tone: within normal limits Gait & Station: normal Patient leans: N/A  Psychiatric Specialty Exam: Review of Systems  Blood pressure 104/77, pulse (!) 121, temperature 97.7 F (36.5 C), temperature source Oral, resp. rate 18, last menstrual period 12/12/2019, SpO2 99 %.There is no height or weight on file to calculate BMI.  General Appearance: Casual  Eye Contact::  Fair  Speech:  Clear and Coherent  Volume:  Normal  Mood:  Euthymic  Affect:  Appropriate  Thought Process:  Coherent  Orientation:  Full (Time, Place, and Person)  Thought Content:  Logical  Suicidal Thoughts:  No  Homicidal Thoughts:  No  Memory:  Recent;   Fair  Judgement:  Fair  Insight:  Fair  Psychomotor Activity:  Normal  Concentration:  Fair  Recall:  [employer] of Knowledge:Fair  Language: Fair  Akathisia:  No  Handed:  Right  AIMS (if indicated):     Assets:  Communication Skills Desire for Improvement Housing Leisure Time Social Support  Sleep:  Number of Hours: 6.75  Cognition: WNL  ADL's:  Intact   Mental Status Per Nursing Assessment::   On Admission:  NA  Demographic Factors:  Caucasian and Low socioeconomic status  Loss Factors: Financial problems/change in socioeconomic status  Historical Factors: Impulsivity  Risk Reduction Factors:   Sense of responsibility to family, Living with another person, especially a relative, Positive social support and Positive therapeutic relationship  Continued Clinical Symptoms:  Alcohol/Substance Abuse/Dependencies  Cognitive Features That Contribute To Risk:  None    Suicide Risk:  Minimal: No identifiable suicidal ideation.  Patients presenting with no risk factors  but with morbid ruminations; may be classified as minimal risk based on the severity of the depressive symptoms   Follow-up Information    [facility]. Go on 01/17/2020.   Specialty: Behavioral Health Why: You have a walk in appointment for therapy on 01/17/20 at 8:00 am.  You also have an appointment for medication management on 02/06/20 at 9:30 am.  These appointments will be held in person. Contact information: [address] [place] [address] [phone]              Plan Of Care/Follow-up recommendations:  Other:  Follow-up with outpatient care  Clement Sayres, MD 01/11/2020, 9:38 AM

## 2020-01-11 NOTE — Progress Notes (Signed)
The focus of this group is to help patients establish daily goals to achieve during treatment and discuss how the patient can incorporate goal setting into their daily lives to aide in recovery.  The patient did not attend group. 

## 2020-01-18 ENCOUNTER — Encounter (HOSPITAL_COMMUNITY)
Admission: EM | Disposition: A | Discharge status: Home / Self Care | Payer: Self-pay | Source: Home / Self Care | Attending: Emergency Medicine

## 2020-01-18 ENCOUNTER — Emergency Department (HOSPITAL_COMMUNITY): Payer: Self-pay

## 2020-01-18 ENCOUNTER — Observation Stay (HOSPITAL_COMMUNITY)
Admission: EM | Admit: 2020-01-18 | Discharge: 2020-01-20 | Disposition: A | Discharge status: Home / Self Care | Payer: Self-pay | Attending: Ophthalmology | Admitting: Ophthalmology

## 2020-01-18 ENCOUNTER — Emergency Department (HOSPITAL_COMMUNITY): Payer: Self-pay | Admitting: Certified Registered"

## 2020-01-18 ENCOUNTER — Encounter (HOSPITAL_COMMUNITY): Payer: Self-pay | Admitting: *Deleted

## 2020-01-18 ENCOUNTER — Other Ambulatory Visit: Payer: Self-pay

## 2020-01-18 DIAGNOSIS — R519 Headache, unspecified: Secondary | ICD-10-CM | POA: Insufficient documentation

## 2020-01-18 DIAGNOSIS — S20319A Abrasion of unspecified front wall of thorax, initial encounter: Secondary | ICD-10-CM | POA: Insufficient documentation

## 2020-01-18 DIAGNOSIS — Z79899 Other long term (current) drug therapy: Secondary | ICD-10-CM | POA: Insufficient documentation

## 2020-01-18 DIAGNOSIS — S0532XA Ocular laceration without prolapse or loss of intraocular tissue, left eye, initial encounter: Secondary | ICD-10-CM | POA: Diagnosis present

## 2020-01-18 DIAGNOSIS — R109 Unspecified abdominal pain: Secondary | ICD-10-CM | POA: Insufficient documentation

## 2020-01-18 DIAGNOSIS — F17210 Nicotine dependence, cigarettes, uncomplicated: Secondary | ICD-10-CM | POA: Insufficient documentation

## 2020-01-18 DIAGNOSIS — S0522XA Ocular laceration and rupture with prolapse or loss of intraocular tissue, left eye, initial encounter: Principal | ICD-10-CM | POA: Insufficient documentation

## 2020-01-18 DIAGNOSIS — Z20822 Contact with and (suspected) exposure to covid-19: Secondary | ICD-10-CM | POA: Insufficient documentation

## 2020-01-18 DIAGNOSIS — S0530XA Ocular laceration without prolapse or loss of intraocular tissue, unspecified eye, initial encounter: Secondary | ICD-10-CM

## 2020-01-18 DIAGNOSIS — T1490XA Injury, unspecified, initial encounter: Secondary | ICD-10-CM

## 2020-01-18 HISTORY — PX: RUPTURED GLOBE EXPLORATION AND REPAIR: SHX2366

## 2020-01-18 LAB — CBC
HCT: 35.9 % — ABNORMAL LOW (ref 36.0–46.0)
Hemoglobin: 11.3 g/dL — ABNORMAL LOW (ref 12.0–15.0)
MCH: 26.2 pg (ref 26.0–34.0)
MCHC: 31.5 g/dL (ref 30.0–36.0)
MCV: 83.1 fL (ref 80.0–100.0)
Platelets: 227 10*3/uL (ref 150–400)
RBC: 4.32 MIL/uL (ref 3.87–5.11)
RDW: 13.9 % (ref 11.5–15.5)
WBC: 12.1 10*3/uL — ABNORMAL HIGH (ref 4.0–10.5)
nRBC: 0 % (ref 0.0–0.2)

## 2020-01-18 LAB — I-STAT BETA HCG BLOOD, ED (MC, WL, AP ONLY): I-stat hCG, quantitative: <5 m[IU]/mL (ref <5)

## 2020-01-18 LAB — I-STAT CHEM 8, ED
BUN: 18 mg/dL (ref 6–20)
Calcium, Ion: 1.04 mmol/L — ABNORMAL LOW (ref 1.15–1.40)
Chloride: 108 mmol/L (ref 98–111)
Creatinine, Ser: 0.7 mg/dL (ref 0.44–1.00)
Glucose, Bld: 142 mg/dL — ABNORMAL HIGH (ref 70–99)
HCT: 34 % — ABNORMAL LOW (ref 36.0–46.0)
Hemoglobin: 11.6 g/dL — ABNORMAL LOW (ref 12.0–15.0)
Potassium: 3.4 mmol/L — ABNORMAL LOW (ref 3.5–5.1)
Sodium: 140 mmol/L (ref 135–145)
TCO2: 21 mmol/L — ABNORMAL LOW (ref 22–32)

## 2020-01-18 LAB — COMPREHENSIVE METABOLIC PANEL
ALT: 20 U/L (ref 0–44)
AST: 28 U/L (ref 15–41)
Albumin: 3.4 g/dL — ABNORMAL LOW (ref 3.5–5.0)
Alkaline Phosphatase: 52 U/L (ref 38–126)
Anion gap: 10 (ref 5–15)
BUN: 16 mg/dL (ref 6–20)
CO2: 20 mmol/L — ABNORMAL LOW (ref 22–32)
Calcium: 8.4 mg/dL — ABNORMAL LOW (ref 8.9–10.3)
Chloride: 110 mmol/L (ref 98–111)
Creatinine, Ser: 0.81 mg/dL (ref 0.44–1.00)
GFR, Estimated: >60 mL/min (ref >60)
Glucose, Bld: 151 mg/dL — ABNORMAL HIGH (ref 70–99)
Potassium: 3.4 mmol/L — ABNORMAL LOW (ref 3.5–5.1)
Sodium: 140 mmol/L (ref 135–145)
Total Bilirubin: 0.5 mg/dL (ref 0.3–1.2)
Total Protein: 5.5 g/dL — ABNORMAL LOW (ref 6.5–8.1)

## 2020-01-18 LAB — RESPIRATORY PANEL BY RT PCR (FLU A&B, COVID)
Influenza A by PCR: NEGATIVE
Influenza B by PCR: NEGATIVE
SARS Coronavirus 2 by RT PCR: NEGATIVE

## 2020-01-18 LAB — SAMPLE TO BLOOD BANK

## 2020-01-18 LAB — PROTIME-INR
INR: 1.2 (ref 0.8–1.2)
Prothrombin Time: 14.3 seconds (ref 11.4–15.2)

## 2020-01-18 LAB — LACTIC ACID, PLASMA: Lactic Acid, Venous: 1.3 mmol/L (ref 0.5–1.9)

## 2020-01-18 LAB — ETHANOL: Alcohol, Ethyl (B): <10 mg/dL (ref <10)

## 2020-01-18 SURGERY — REPAIR, RUPTURE, GLOBE
Anesthesia: General | Site: Eye | Laterality: Left

## 2020-01-18 MED ORDER — DEXAMETHASONE SODIUM PHOSPHATE 10 MG/ML IJ SOLN
INTRAMUSCULAR | Status: DC | PRN
  Administered 2020-01-18: 10 mg via INTRAVENOUS

## 2020-01-18 MED ORDER — ONDANSETRON HCL 4 MG/2ML IJ SOLN
4.0000 mg | Freq: Once | INTRAMUSCULAR | Status: AC
  Administered 2020-01-18: 4 mg via INTRAVENOUS
  Filled 2020-01-18: qty 2

## 2020-01-18 MED ORDER — TETRACAINE HCL 0.5 % OP SOLN
OPHTHALMIC | Status: DC | PRN
  Administered 2020-01-18: 2 [drp] via OPHTHALMIC

## 2020-01-18 MED ORDER — IOHEXOL 300 MG/ML  SOLN
100.0000 mL | Freq: Once | INTRAMUSCULAR | Status: AC | PRN
  Administered 2020-01-18: 100 mL via INTRAVENOUS

## 2020-01-18 MED ORDER — MIDAZOLAM HCL 2 MG/2ML IJ SOLN
INTRAMUSCULAR | Status: DC | PRN
  Administered 2020-01-18: 2 mg via INTRAVENOUS

## 2020-01-18 MED ORDER — SODIUM CHLORIDE 0.9 % IV SOLN
2.0000 g | Freq: Once | INTRAVENOUS | Status: AC
  Administered 2020-01-18: 2 g via INTRAVENOUS
  Filled 2020-01-18: qty 2

## 2020-01-18 MED ORDER — GABAPENTIN 100 MG PO CAPS
100.0000 mg | ORAL_CAPSULE | Freq: Three times a day (TID) | ORAL | Status: DC
  Administered 2020-01-18 – 2020-01-20 (×5): 100 mg via ORAL
  Filled 2020-01-18 (×5): qty 1

## 2020-01-18 MED ORDER — DEXMEDETOMIDINE (PRECEDEX) IN NS 20 MCG/5ML (4 MCG/ML) IV SYRINGE
PREFILLED_SYRINGE | INTRAVENOUS | Status: AC
  Filled 2020-01-18: qty 5

## 2020-01-18 MED ORDER — LIDOCAINE HCL (PF) 1 % IJ SOLN
INTRAMUSCULAR | Status: AC
  Filled 2020-01-18: qty 5

## 2020-01-18 MED ORDER — FENTANYL CITRATE (PF) 100 MCG/2ML IJ SOLN
50.0000 ug | INTRAMUSCULAR | Status: DC | PRN
  Administered 2020-01-18: 50 ug via INTRAVENOUS
  Filled 2020-01-18 (×2): qty 2

## 2020-01-18 MED ORDER — TOBRAMYCIN-DEXAMETHASONE 0.3-0.1 % OP SUSP
OPHTHALMIC | Status: AC
  Filled 2020-01-18: qty 2.5

## 2020-01-18 MED ORDER — DEXMEDETOMIDINE (PRECEDEX) IN NS 20 MCG/5ML (4 MCG/ML) IV SYRINGE
PREFILLED_SYRINGE | INTRAVENOUS | Status: DC | PRN
  Administered 2020-01-18 (×2): 8 ug via INTRAVENOUS
  Administered 2020-01-18: 4 ug via INTRAVENOUS

## 2020-01-18 MED ORDER — PROPOFOL 10 MG/ML IV BOLUS
INTRAVENOUS | Status: DC | PRN
  Administered 2020-01-18: 120 mg via INTRAVENOUS

## 2020-01-18 MED ORDER — HYDROMORPHONE HCL 1 MG/ML IJ SOLN
1.0000 mg | Freq: Once | INTRAMUSCULAR | Status: AC
  Administered 2020-01-18: 1 mg via INTRAVENOUS
  Filled 2020-01-18: qty 1

## 2020-01-18 MED ORDER — ATROPINE SULFATE 1 % OP SOLN
OPHTHALMIC | Status: DC | PRN
  Administered 2020-01-18 (×2): 2 [drp] via OPHTHALMIC

## 2020-01-18 MED ORDER — BSS IO SOLN
INTRAOCULAR | Status: AC
  Filled 2020-01-18: qty 30

## 2020-01-18 MED ORDER — VANCOMYCIN HCL 750 MG/150ML IV SOLN
750.0000 mg | Freq: Three times a day (TID) | INTRAVENOUS | Status: DC
  Administered 2020-01-18 – 2020-01-19 (×2): 750 mg via INTRAVENOUS
  Filled 2020-01-18 (×3): qty 150

## 2020-01-18 MED ORDER — LIDOCAINE HCL (PF) 1 % IJ SOLN
INTRAMUSCULAR | Status: AC
  Filled 2020-01-18: qty 30

## 2020-01-18 MED ORDER — NA CHONDROIT SULF-NA HYALURON 40-30 MG/ML IO SOSY
INTRAOCULAR | Status: AC
  Filled 2020-01-18: qty 0.5

## 2020-01-18 MED ORDER — TOBRAMYCIN-DEXAMETHASONE 0.3-0.1 % OP OINT
TOPICAL_OINTMENT | OPHTHALMIC | Status: AC
  Filled 2020-01-18: qty 3.5

## 2020-01-18 MED ORDER — ONDANSETRON HCL 4 MG/2ML IJ SOLN
INTRAMUSCULAR | Status: DC | PRN
  Administered 2020-01-18: 4 mg via INTRAVENOUS

## 2020-01-18 MED ORDER — NA CHONDROIT SULF-NA HYALURON 40-30 MG/ML IO SOSY
INTRAOCULAR | Status: AC
  Filled 2020-01-18: qty 2

## 2020-01-18 MED ORDER — EPHEDRINE SULFATE-NACL 50-0.9 MG/10ML-% IV SOSY
PREFILLED_SYRINGE | INTRAVENOUS | Status: DC | PRN
  Administered 2020-01-18 (×3): 10 mg via INTRAVENOUS

## 2020-01-18 MED ORDER — PROPOFOL 10 MG/ML IV BOLUS
INTRAVENOUS | Status: AC
  Filled 2020-01-18: qty 20

## 2020-01-18 MED ORDER — NA CHONDROIT SULF-NA HYALURON 40-30 MG/ML IO SOSY
INTRAOCULAR | Status: DC | PRN
  Administered 2020-01-18: 0.5 mL via INTRAOCULAR
  Administered 2020-01-18: 1 mL via INTRAOCULAR

## 2020-01-18 MED ORDER — ATROPINE SULFATE 1 % OP SOLN
OPHTHALMIC | Status: AC
  Filled 2020-01-18: qty 5

## 2020-01-18 MED ORDER — ACETAZOLAMIDE SODIUM 500 MG IJ SOLR
500.0000 mg | Freq: Once | INTRAMUSCULAR | Status: AC
  Administered 2020-01-18: 500 mg via INTRAVENOUS
  Filled 2020-01-18: qty 500

## 2020-01-18 MED ORDER — PROMETHAZINE HCL 25 MG/ML IJ SOLN
25.0000 mg | Freq: Once | INTRAMUSCULAR | Status: AC
  Administered 2020-01-18: 25 mg via INTRAVENOUS
  Filled 2020-01-18: qty 1

## 2020-01-18 MED ORDER — TOBRAMYCIN 0.3 % OP OINT
TOPICAL_OINTMENT | OPHTHALMIC | Status: DC | PRN
  Administered 2020-01-18: 1 via OPHTHALMIC

## 2020-01-18 MED ORDER — FLUORESCEIN SODIUM 1 MG OP STRP
ORAL_STRIP | OPHTHALMIC | Status: AC
  Filled 2020-01-18: qty 2

## 2020-01-18 MED ORDER — ONDANSETRON HCL 4 MG/2ML IJ SOLN
INTRAMUSCULAR | Status: AC
  Filled 2020-01-18: qty 2

## 2020-01-18 MED ORDER — ESCITALOPRAM OXALATE 10 MG PO TABS
10.0000 mg | ORAL_TABLET | Freq: Every day | ORAL | Status: DC
  Administered 2020-01-18 – 2020-01-20 (×3): 10 mg via ORAL
  Filled 2020-01-18 (×3): qty 1

## 2020-01-18 MED ORDER — MORPHINE SULFATE (PF) 4 MG/ML IV SOLN
4.0000 mg | Freq: Once | INTRAVENOUS | Status: AC
  Administered 2020-01-18: 4 mg via INTRAVENOUS
  Filled 2020-01-18: qty 1

## 2020-01-18 MED ORDER — SUCCINYLCHOLINE CHLORIDE 200 MG/10ML IV SOSY
PREFILLED_SYRINGE | INTRAVENOUS | Status: DC | PRN
  Administered 2020-01-18: 120 mg via INTRAVENOUS

## 2020-01-18 MED ORDER — BUPIVACAINE HCL (PF) 0.25 % IJ SOLN
INTRAMUSCULAR | Status: AC
  Filled 2020-01-18: qty 10

## 2020-01-18 MED ORDER — QUETIAPINE FUMARATE 50 MG PO TABS
50.0000 mg | ORAL_TABLET | Freq: Every day | ORAL | Status: DC
  Administered 2020-01-18 – 2020-01-19 (×2): 50 mg via ORAL
  Filled 2020-01-18 (×2): qty 1

## 2020-01-18 MED ORDER — VANCOMYCIN HCL 1250 MG/250ML IV SOLN
1250.0000 mg | Freq: Once | INTRAVENOUS | Status: AC
  Administered 2020-01-18: 1250 mg via INTRAVENOUS
  Filled 2020-01-18: qty 250

## 2020-01-18 MED ORDER — LIDOCAINE HCL 1 % IJ SOLN
INTRAMUSCULAR | Status: DC | PRN
  Administered 2020-01-18: 2.5 mL

## 2020-01-18 MED ORDER — MIDAZOLAM HCL 2 MG/2ML IJ SOLN
INTRAMUSCULAR | Status: AC
  Filled 2020-01-18: qty 2

## 2020-01-18 MED ORDER — FENTANYL CITRATE (PF) 100 MCG/2ML IJ SOLN: 25.0000 ug | INTRAMUSCULAR | Status: DC | PRN

## 2020-01-18 MED ORDER — HYDROCODONE-ACETAMINOPHEN 5-325 MG PO TABS
1.0000 | ORAL_TABLET | ORAL | Status: DC | PRN
  Administered 2020-01-19 – 2020-01-20 (×4): 2 via ORAL
  Filled 2020-01-18 (×4): qty 2

## 2020-01-18 MED ORDER — TRIAMCINOLONE ACETONIDE 40 MG/ML IJ SUSP
INTRAMUSCULAR | Status: AC
  Filled 2020-01-18: qty 5

## 2020-01-18 MED ORDER — SUGAMMADEX SODIUM 200 MG/2ML IV SOLN
INTRAVENOUS | Status: DC | PRN
  Administered 2020-01-18: 130 mg via INTRAVENOUS

## 2020-01-18 MED ORDER — DIPHENHYDRAMINE HCL 50 MG/ML IJ SOLN
INTRAMUSCULAR | Status: DC | PRN
  Administered 2020-01-18: 25 mg via INTRAVENOUS

## 2020-01-18 MED ORDER — SODIUM CHLORIDE 0.9 % IV SOLN: INTRAVENOUS | Status: DC

## 2020-01-18 MED ORDER — DEXAMETHASONE SODIUM PHOSPHATE 10 MG/ML IJ SOLN
INTRAMUSCULAR | Status: AC
  Filled 2020-01-18: qty 1

## 2020-01-18 MED ORDER — TRIAMCINOLONE ACETONIDE 40 MG/ML IJ SUSP
INTRAMUSCULAR | Status: DC | PRN
  Administered 2020-01-18: 80 mg

## 2020-01-18 MED ORDER — FENTANYL CITRATE (PF) 250 MCG/5ML IJ SOLN
INTRAMUSCULAR | Status: DC | PRN
  Administered 2020-01-18: 100 ug via INTRAVENOUS

## 2020-01-18 MED ORDER — BUPIVACAINE HCL (PF) 0.25 % IJ SOLN
INTRAMUSCULAR | Status: DC | PRN
  Administered 2020-01-18: 2.5 mL

## 2020-01-18 MED ORDER — LIDOCAINE 2% (20 MG/ML) 5 ML SYRINGE
INTRAMUSCULAR | Status: DC | PRN
  Administered 2020-01-18: 60 mg via INTRAVENOUS

## 2020-01-18 MED ORDER — CHLORHEXIDINE GLUCONATE 0.12 % MT SOLN: 15.0000 mL | OROMUCOSAL | Status: DC

## 2020-01-18 MED ORDER — ROCURONIUM BROMIDE 10 MG/ML (PF) SYRINGE
PREFILLED_SYRINGE | INTRAVENOUS | Status: DC | PRN
  Administered 2020-01-18: 10 mg via INTRAVENOUS
  Administered 2020-01-18: 30 mg via INTRAVENOUS

## 2020-01-18 MED ORDER — HYDROMORPHONE HCL 1 MG/ML IJ SOLN
0.5000 mg | INTRAMUSCULAR | Status: DC | PRN
  Administered 2020-01-18 – 2020-01-19 (×2): 1 mg via INTRAVENOUS
  Filled 2020-01-18 (×3): qty 1

## 2020-01-18 MED ORDER — FENTANYL CITRATE (PF) 250 MCG/5ML IJ SOLN
INTRAMUSCULAR | Status: AC
  Filled 2020-01-18: qty 5

## 2020-01-18 MED ORDER — DIPHENHYDRAMINE HCL 50 MG/ML IJ SOLN
INTRAMUSCULAR | Status: AC
  Filled 2020-01-18: qty 1

## 2020-01-18 SURGICAL SUPPLY — 24 items
BLADE KERATOME 2.75 (BLADE) ×1 IMPLANT
BLADE KERATOME 2.75MM (BLADE) ×1
COVER SURGICAL LIGHT HANDLE (MISCELLANEOUS) ×3 IMPLANT
DRAPE OPHTHALMIC 40X48 W POUCH (DRAPES) ×3 IMPLANT
GLOVE SURG LTX SZ8 (GLOVE) ×2 IMPLANT
GLOVE SURG SYN 7.5  E (GLOVE) ×3
GLOVE SURG SYN 7.5 E (GLOVE) ×1 IMPLANT
GLOVE SURG SYN 7.5 PF PI (GLOVE) ×1 IMPLANT
GOWN STRL REUS W/ TWL LRG LVL3 (GOWN DISPOSABLE) ×1 IMPLANT
GOWN STRL REUS W/TWL LRG LVL3 (GOWN DISPOSABLE) ×6
KIT BASIN OR (CUSTOM PROCEDURE TRAY) ×3 IMPLANT
PACK VITRECTOMY CUSTOM (CUSTOM PROCEDURE TRAY) ×2 IMPLANT
SOL ANTI FOG 6CC (MISCELLANEOUS) ×1 IMPLANT
SOLUTION ANTI FOG 6CC (MISCELLANEOUS) ×2
SPEAR EYE SURG WECK-CEL (MISCELLANEOUS) ×2 IMPLANT
SUT CHROMIC 7 0 TG140 8 (SUTURE) ×3 IMPLANT
SUT ETHILON 10 0 CS140 6 (SUTURE) ×6 IMPLANT
SUT ETHILON 8 0 TG100 8 (SUTURE) ×2 IMPLANT
SUT ETHILON 9 0 TG140 8 (SUTURE) ×3 IMPLANT
SUT PLAIN 6 0 TG1408 (SUTURE) ×2 IMPLANT
SUT VICRYL 8 0 TG140 8 (SUTURE) ×2 IMPLANT
SUT VICRYL 9-0 (SUTURE) ×2 IMPLANT
TOWEL GREEN STERILE FF (TOWEL DISPOSABLE) ×6 IMPLANT
WATER STERILE IRR 1000ML POUR (IV SOLUTION) ×3 IMPLANT

## 2020-01-18 NOTE — Transfer of Care (Signed)
Immediate Anesthesia Transfer of Care Note  Patient: Angelena Sole  Procedure(s) Performed: REPAIR OF RUPTURED GLOBE (Left Eye)  Patient Location: PACU  Anesthesia Type:General  Level of Consciousness: drowsy  Airway & Oxygen Therapy: Patient Spontanous Breathing  Post-op Assessment: Report given to RN, Post -op Vital signs reviewed and stable and Patient moving all extremities  Post vital signs: Reviewed and stable  Last Vitals:  Vitals Value Taken Time  BP 104/50 01/18/20 1919  Temp    Pulse 77 01/18/20 1922  Resp 17 01/18/20 1922  SpO2 97 % 01/18/20 1922  Vitals shown include unvalidated device data.  Last Pain:  Vitals:   01/18/20 1500  TempSrc:   PainSc: Asleep         Complications: No complications documented.

## 2020-01-18 NOTE — Progress Notes (Signed)
Patient unable to recall when she last ate and drank. Anesthesia made aware.

## 2020-01-18 NOTE — Progress Notes (Signed)
Pharmacy Antibiotic Note  Abigail Rosario is a 39 y.o. female admitted on 01/18/2020 with open globe rupture.  Pharmacy has been consulted for Vancomycin dosing. She also received a dose of ceftazidime in the ED. WBC 12.1. SCr 0.7  Plan: -Vancomycin 1250 mg IV load followed by vancomycin 750 mg IV Q 8 hours  -Monitor CBC, renal fx, cultures and clinical progress -F/u maintenance ceftazidime dose  -VT as steady state   Height: 5\' 7"  (170.2 cm) Weight: 61.2 kg (135 lb) IBW/kg (Calculated) : 61.6  Temp (24hrs), Avg:97.8 F (36.6 C), Min:97.8 F (36.6 C), Max:97.8 F (36.6 C)  Recent Labs  Lab 01/18/20 0940 01/18/20 0958  WBC 12.1*  --   CREATININE 0.81 0.70  LATICACIDVEN 1.3  --     Estimated Creatinine Clearance: 93 mL/min (by C-G formula based on SCr of 0.7 mg/dL).    Allergies  Allergen Reactions  . Septra [Sulfamethoxazole-Trimethoprim] Hives and Rash    Antimicrobials this admission: Vanc 11/19 >>  Ceftazidime 11/19 >>   Dose adjustments this admission:  Microbiology results:   Thank you for allowing pharmacy to be a part of this patient's care.  12/19, PharmD., BCPS, BCCCP Clinical Pharmacist Please refer to [facility] for unit-specific pharmacist

## 2020-01-18 NOTE — Op Note (Signed)
OPHTHALMOLOGY OPERATIVE NOTE  SURGEON: Mack Hook, M.D.   OPERATIVE EYE: Left  PRE OP DIAGNOSIS: 360 Subconjunctival Hemorrhage, Suspected ruptured globe.   POST OP DIAGNOSIS: Dislocated intraocular lens, acute angle closure glaucoma, vitreous hemorrhage  PROCEDURE: Exam under anesthesia, Exploration of left globe,  Extraction of intraocular lens.   ANESTHESIA: General anesthesia  COMPLICATIONS:  NONE  DESCRIPTION OF PROCEDURE:  The patient was seen in the preoperative area where the surgical eye was marked. The risks and benefits of the surgery were reviewed including loss of vision, loss of eye, need for additional surgery. The patient was brought back to the operating room. The eye was prepped and draped in the usual sterile fashion for surgery.   First an exam under anesthesia was performed.  The eye was inspected. There were two eyelid lacerations approximately 3cm long along the left upper eyelid.  The eye was noted to have 330 degrees of subconjunctival hemorrhagic chemosis. The cornea was grossly clear.  The lens was dislocated and entirely into the anterior chamber. There was peripheral iris-corneal touch.  Intraocular pressure was >65 by tonopen (max 65). A indirect headlamp and 20D lens was used to inspect the posterior pole.  The vitreous had scant hemorrhage. There nerve appeared healthy. The macula looked normal. The peripheral retina was largely intact with rare peripheral hemorrhage. Limited view was obtained to the peripheral retina. No clear retinal detachment was noted.  . A complete scleral depressed exam was not performed due to concern for subconjunctival rupture.  Next, an exploration of the left globe was performed. A lid speculum was placed in the eye.  Wescotts and forceps were used to make a 360 degree peritomy.  The conj and tenons were bluntly dissected from the globe.  A muscle hook was used to isolate each rectus muscle.  No rupture was noted at any point  anterior to the muscle insertions.  Coupled with a posterior exam that appeared to be normal, I decided there was no globe rupture.   Next, attention was turned to the intraocular lens causing acute angle closure glaucoma. An intracapsular cataract extraction was performed. A 1 mm paracentesis was made at the 3 o'clock position. Viscoat was instilled into the anterior chamber, displacing vitreous posteriorly and the lens anteriorly.  A 2.80mm keratome was used to make a superior scleral tunnel approximately 6 mm wide.  A lens scoop was used to remove the intraocular lens with capsule.  Vitreous prolapsed through the wound was excised with scissors. Viscoat was instilled into the eye.  A 8-0 nylon was used to close the scleral tunnel. The wound was checked again for vitreous and remaining strands were excised with scissors. BSS was instilled into the eye. The wound was found to be water tight.  The paracentesis wound was hydrated. The pressure within the eye was elevated to 40 and all wounds were checked for vitreous. Pressure was released from the paracentesis until the eye was soft.  A vicryl suture was used to close the 360 degree peritomy.  A retrobulbar block was performed with 50:50 mixture of lidocaine and bupivacaine.  A subtenon injection of approximately 60mg  of kenalog was preformed.  The lid speculum was removed.   Attention was then turned to the eyelid lacerations.  The lacerations were closed with a running 6-0 plain gut suture.   Ointment was placed on the incisions and on the eye. Atropine was instilled on the eye. A pressure patch was placed on the eye.   The procedure was  terminated and the patient taken to the PACU in stable condition.

## 2020-01-18 NOTE — Anesthesia Procedure Notes (Signed)
Procedure Name: Intubation Date/Time: 01/18/2020 5:50 PM Performed by: Adria Dill, CRNA Pre-anesthesia Checklist: Patient identified, Emergency Drugs available, Suction available and Patient being monitored Patient Re-evaluated:Patient Re-evaluated prior to induction Oxygen Delivery Method: Circle system utilized Preoxygenation: Pre-oxygenation with 100% oxygen Induction Type: IV induction and Rapid sequence Ventilation: Mask ventilation without difficulty Laryngoscope Size: Miller and 2 Grade View: Grade I Tube type: Oral Tube size: 7.0 mm Number of attempts: 1 Airway Equipment and Method: Stylet and Oral airway Placement Confirmation: ETT inserted through vocal cords under direct vision,  positive ETCO2 and breath sounds checked- equal and bilateral Secured at: 21 cm Tube secured with: Tape Dental Injury: Teeth and Oropharynx as per pre-operative assessment

## 2020-01-18 NOTE — Anesthesia Postprocedure Evaluation (Signed)
Anesthesia Post Note  Patient: Angelena Sole  Procedure(s) Performed: REPAIR OF RUPTURED GLOBE (Left Eye)     Patient location during evaluation: PACU Anesthesia Type: General Level of consciousness: awake and alert Pain management: pain level controlled Vital Signs Assessment: post-procedure vital signs reviewed and stable Respiratory status: spontaneous breathing, nonlabored ventilation and respiratory function stable Cardiovascular status: blood pressure returned to baseline and stable Postop Assessment: no apparent nausea or vomiting Anesthetic complications: no   No complications documented.  Last Vitals:  Vitals:   01/18/20 1945 01/18/20 2000  BP: 105/68 105/67  Pulse: (!) 55 (!) 50  Resp: 15 14  Temp:    SpO2: 97% 97%    Last Pain:  Vitals:   01/18/20 2015  TempSrc:   PainSc: (P) Asleep                 Khushi Zupko,W. EDMOND
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
## 2020-01-18 NOTE — ED Notes (Signed)
Patient transported to CT 
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
## 2020-01-18 NOTE — ED Provider Notes (Signed)
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 to also reduce intraocular and intracranial pressure from pain and vomiting.  Mother at bedside was updated regarding status.  CT scans obtained as noted below.  Ophtho consulted and planning for OR for globe rupture.  IV antibiotics ordered for globe rupture as well.   Clinical Course as of Jan 17 1642  Fri Jan 18, 2020  1112 Pain is still uncontrolled.  Very difficult situation, patient is screaming. I advised nursing to give another 1 mg dilaudid as well as phergan for her nausea.   [MT]  1140 Concern for open globe rupture on CT.  Antibiotics ordered.  Will page ophtho.  Tdap given 10 days ago on prior ED visit   [MT]  1157 IMPRESSION: 1. No acute traumatic finding in the chest, abdomen or pelvis. Healing fracture of the right lateral seventh rib. 2. Multiple stones in the gallbladder including 1 in the gallbladder eck. No CT evidence of cholecystitis. 3. Moderate amount of fecal matter in the colon.   [MT]  1221 Mother and patient both updated regarding imaging.  Awaiting ophtho assessment & antibiotics from pharmacy.  No other acute  traumatic injuries noted on ct imaging today   [MT]  1503 Awaiting ophthalmology evaluation   [MT]    Clinical Course User Index [MT] Linkin Vizzini, Kermit Balo, MD    Final Clinical Impression(s) / ED Diagnoses Final diagnoses:  Trauma  Rupture of globe, unspecified laterality, initial encounter    Rx / DC Orders ED Discharge Orders    None       Terald Sleeper, MD 01/18/20 1643

## 2020-01-18 NOTE — ED Provider Notes (Signed)
  Physical Exam  BP 130/77   Pulse (!) 40   Temp 97.8 F (36.6 C) (Oral)   Resp 13   Ht 5\' 7"  (1.702 m)   Wt 61.2 kg   SpO2 99%   BMI 21.14 kg/m   Physical Exam  ED Course/Procedures   Clinical Course as of Jan 17 1630  Fri Jan 18, 2020  1112 Pain is still uncontrolled.  Very difficult situation, patient is screaming. I advised nursing to give another 1 mg dilaudid as well as phergan for her nausea.   [MT]  1140 Concern for open globe rupture on CT.  Antibiotics ordered.  Will page ophtho.  Tdap given 10 days ago on prior ED visit   [MT]  1157 IMPRESSION: 1. No acute traumatic finding in the chest, abdomen or pelvis. Healing fracture of the right lateral seventh rib. 2. Multiple stones in the gallbladder including 1 in the gallbladder eck. No CT evidence of cholecystitis. 3. Moderate amount of fecal matter in the colon.   [MT]  1221 Mother and patient both updated regarding imaging.  Awaiting ophtho assessment & antibiotics from pharmacy.  No other acute traumatic injuries noted on ct imaging today   [MT]  1503 Awaiting ophthalmology evaluation   [MT]    Clinical Course User Index [MT] 1504 Renaye Rakers, MD    Procedures  MDM  Care assumed at 3 pm. Patient has ruptured globe on CT. Plan to have ophtho consult   4:31 PM Dr. Kermit Balo at bedside. Will take patient to the OR. No other traumatic injuries on CT and ophtho called trauma to discuss case      Artemio Aly, MD 01/18/20 [phone]

## 2020-01-18 NOTE — ED Notes (Signed)
Pt returned from CT °
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
## 2020-01-19 ENCOUNTER — Encounter (HOSPITAL_COMMUNITY): Payer: Self-pay | Admitting: Ophthalmology

## 2020-01-19 LAB — HIV ANTIBODY (ROUTINE TESTING W REFLEX): HIV Screen 4th Generation wRfx: NONREACTIVE

## 2020-01-19 MED ORDER — PREDNISOLONE ACETATE 1 % OP SUSP
1.0000 [drp] | Freq: Four times a day (QID) | OPHTHALMIC | Status: DC
  Administered 2020-01-19 – 2020-01-20 (×4): 1 [drp] via OPHTHALMIC
  Filled 2020-01-19: qty 5

## 2020-01-19 MED ORDER — OFLOXACIN 0.3 % OP SOLN
1.0000 [drp] | Freq: Four times a day (QID) | OPHTHALMIC | Status: DC
  Administered 2020-01-19 – 2020-01-20 (×4): 1 [drp] via OPHTHALMIC
  Filled 2020-01-19: qty 5

## 2020-01-19 MED ORDER — WHITE PETROLATUM EX OINT
TOPICAL_OINTMENT | CUTANEOUS | Status: AC
  Administered 2020-01-19: 0.2
  Filled 2020-01-19: qty 28.35

## 2020-01-19 MED ORDER — DORZOLAMIDE HCL-TIMOLOL MAL 2-0.5 % OP SOLN
1.0000 [drp] | Freq: Two times a day (BID) | OPHTHALMIC | Status: DC
  Administered 2020-01-19 – 2020-01-20 (×3): 1 [drp] via OPHTHALMIC
  Filled 2020-01-19: qty 10

## 2020-01-19 MED ORDER — NICOTINE 21 MG/24HR TD PT24
21.0000 mg | MEDICATED_PATCH | Freq: Every day | TRANSDERMAL | Status: DC
  Administered 2020-01-19 – 2020-01-20 (×2): 21 mg via TRANSDERMAL
  Filled 2020-01-19 (×2): qty 1

## 2020-01-19 NOTE — Progress Notes (Signed)
OPHTHALMOLOGY NOTE   S:  Moderate discomfort overnight.  Eye is scratchy. Sensitive to light.  Pain better than yesterday.  "I feel sore all over". Able to ambulate overnight.   O VA: LP OS Motility: Full ductions and versions IOP: Soft by tactile tensions Ext: Left periorbital ecchymosis.  Lid lacerations intact. Sutures intact.  LL: Edema OS Coj: Sub conj hemorrhage, No wound gape OS Cornea: Abrasion superiorly, Clear OS.  AC: 3+ Cell, Deep, Unable to appreciate vitreous in AC, No obvious lens fragments.  Iris: Dilated, Superior displaced oval, post trauma OS Lens: Aphakia OS.   Assesment and plan:  Dislocated Lens, Acute angle closure glaucoma, POD1 globe exploration and removal of intraocular lens OS.  - Appropriate post operative course.  - Start difluprednate QID OS  - Start Ofloxacin QID OS - Start Dorzolamide/Timolol BID OS - Stop IV dilaudid - Stop IV Abx - Transition to PO pain control - Social Work consult for evaluation of safe discharge home.  - Anticipate stay overnight, potential discharge tomorrow.     Mack Hook, M.D.  [facility] [address] [place], [place] [address] [phone] ([phone]

## 2020-01-19 NOTE — Social Work (Signed)
CSW received a consult in regards to a safe discharge plan and SA resources for patient.  CSW met with patient to discuss a safe discharge plan. Patient informed CSW that she was unsure of her discharge plan at this time however her mother is assisting with formulating one. CSW received permission to follow up with patient's mother. CSW asked about SA resources and patient stated that she had resources and declined further resources.  CSW spoke with patient's mother Abigail Rosario and she informed CSW that the plan was for patient to come home with her. Abigail Rosario is aware of her home environment and wants patient to be safe. CSW informed Abigail Rosario that if that plan changed to reached out to the [facility] team.  [facility] team will continue to assist with discharge planning needs.

## 2020-01-20 MED ORDER — DORZOLAMIDE HCL-TIMOLOL MAL 2-0.5 % OP SOLN
1.0000 [drp] | Freq: Two times a day (BID) | OPHTHALMIC | 12 refills | Status: DC
Start: 2020-01-20 — End: 2020-02-28

## 2020-01-20 MED ORDER — HYDROCODONE-ACETAMINOPHEN 5-325 MG PO TABS
1.0000 | ORAL_TABLET | ORAL | 0 refills | Status: DC | PRN
Start: 2020-01-20 — End: 2022-03-09

## 2020-01-20 MED ORDER — OFLOXACIN 0.3 % OP SOLN
1.0000 [drp] | Freq: Four times a day (QID) | OPHTHALMIC | 0 refills | Status: DC
Start: 2020-01-20 — End: 2020-02-28

## 2020-01-20 MED ORDER — PREDNISOLONE ACETATE 1 % OP SUSP: 1.0000 [drp] | Freq: Four times a day (QID) | OPHTHALMIC | 0 refills | Status: DC
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
## 2020-01-20 NOTE — Progress Notes (Signed)
OPHthalmology Note  S:  Pain improved overnight. Some drainage in left eye.  Eye scratching and senstive to bright lgihts.   O:  Va: HM OS IOP: 14 Pupils: No obvious APD OS EOM: Full d/v LL: Lid lacerations closed, sutres in place OS, Erythema, edema improving Conj: Sub conj hemorhage, No wound gape. OS K: No franks staining. Appears clear OS  AC: 3+ Cell, RBC, no obvious vitreous OS Iris: Irregular  Posterior Pole: Unable to exam due to photophobia.   A/P  S/p Globe exploration and removal of intraocular lens- Appropriate post operative course - Discharge Home today - Home w/ PF & Ofloxacin QID OS - Norco for PO pain control  Follow up as outpatient 01/28/2020 10AM at [facility].    Mack Hook, M.D.  [facility] [address] [place], [place] [address] [phone] ([phone]

## 2020-02-06 ENCOUNTER — Encounter (HOSPITAL_COMMUNITY): Payer: Self-pay | Admitting: Physician Assistant

## 2020-02-28 ENCOUNTER — Emergency Department (HOSPITAL_COMMUNITY)
Admission: EM | Admit: 2020-02-28 | Discharge: 2020-02-28 | Disposition: A | Discharge status: Home / Self Care | Payer: Self-pay | Attending: Emergency Medicine | Admitting: Emergency Medicine

## 2020-02-28 ENCOUNTER — Encounter (HOSPITAL_COMMUNITY): Payer: Self-pay | Admitting: Emergency Medicine

## 2020-02-28 DIAGNOSIS — S0532XD Ocular laceration without prolapse or loss of intraocular tissue, left eye, subsequent encounter: Secondary | ICD-10-CM

## 2020-02-28 DIAGNOSIS — S0522XD Ocular laceration and rupture with prolapse or loss of intraocular tissue, left eye, subsequent encounter: Secondary | ICD-10-CM | POA: Insufficient documentation

## 2020-02-28 DIAGNOSIS — X58XXXA Exposure to other specified factors, initial encounter: Secondary | ICD-10-CM | POA: Insufficient documentation

## 2020-02-28 DIAGNOSIS — Z760 Encounter for issue of repeat prescription: Secondary | ICD-10-CM | POA: Insufficient documentation

## 2020-02-28 DIAGNOSIS — R82998 Other abnormal findings in urine: Secondary | ICD-10-CM | POA: Insufficient documentation

## 2020-02-28 DIAGNOSIS — F17200 Nicotine dependence, unspecified, uncomplicated: Secondary | ICD-10-CM | POA: Insufficient documentation

## 2020-02-28 LAB — URINALYSIS, ROUTINE W REFLEX MICROSCOPIC
Bilirubin Urine: NEGATIVE
Glucose, UA: NEGATIVE mg/dL
Hgb urine dipstick: NEGATIVE
Ketones, ur: NEGATIVE mg/dL
Leukocytes,Ua: NEGATIVE
Nitrite: NEGATIVE
Protein, ur: NEGATIVE mg/dL
Specific Gravity, Urine: 1.013 (ref 1.005–1.030)
pH: 6 (ref 5.0–8.0)

## 2020-02-28 LAB — PREGNANCY, URINE: Preg Test, Ur: NEGATIVE

## 2020-02-28 MED ORDER — GABAPENTIN 100 MG PO CAPS: 200.0000 mg | ORAL_CAPSULE | Freq: Three times a day (TID) | ORAL | 0 refills | Status: AC

## 2020-02-28 MED ORDER — OFLOXACIN 0.3 % OP SOLN: 1.0000 [drp] | Freq: Four times a day (QID) | OPHTHALMIC | 0 refills | Status: DC

## 2020-02-28 MED ORDER — PREDNISOLONE ACETATE 1 % OP SUSP: 1.0000 [drp] | Freq: Four times a day (QID) | OPHTHALMIC | 0 refills | Status: DC

## 2020-02-28 MED ORDER — DORZOLAMIDE HCL-TIMOLOL MAL 2-0.5 % OP SOLN: 1.0000 [drp] | Freq: Two times a day (BID) | OPHTHALMIC | 12 refills | Status: DC

## 2020-02-28 MED ORDER — ESCITALOPRAM OXALATE 10 MG PO TABS: 10.0000 mg | ORAL_TABLET | Freq: Every day | ORAL | 0 refills | Status: DC

## 2020-02-28 MED ORDER — QUETIAPINE FUMARATE 50 MG PO TABS: 50.0000 mg | ORAL_TABLET | Freq: Every day | ORAL | 0 refills | Status: AC

## 2020-02-28 NOTE — Discharge Instructions (Addendum)
You were seen in the ER for medication refills. Medicines were sent to [facility] in  [place]   You reported increased urination and dark urine - urinalysis was normal. Monitor symptoms. Return for fever, nausea, vomiting, abdominal or flank/back pain  Go to your eye and psych appointments
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
## 2020-03-14 MED FILL — QUETIAPINE FUMARATE 50 MG T: 50 | 30 days supply | Qty: 30 | Fill #0

## 2020-03-14 MED FILL — PREDNISOLONE AC 1% EYE DROP: 1 | 18 days supply | Qty: 5 | Fill #0

## 2020-03-14 MED FILL — OFLOXACIN 0.3 % SOLN: 0.3 | 18 days supply | Qty: 5 | Fill #0

## 2020-03-14 MED FILL — ESCITALOPRAM 10 MG TABLET: 10 | 30 days supply | Qty: 30 | Fill #0

## 2020-03-14 MED FILL — DORZOLAMIDE-TIMOLOL EYE DRP: 22.3-6.8 | 75 days supply | Qty: 10 | Fill #0

## 2020-03-14 MED FILL — GABAPENTIN 100 MG CAPSULE: 100 | 30 days supply | Qty: 180 | Fill #0

## 2020-03-17 MED FILL — OFLOXACIN 0.3% EYE DROPS: 0.3 | 18 days supply | Qty: 5 | Fill #0

## 2020-07-09 ENCOUNTER — Other Ambulatory Visit: Payer: Self-pay

## 2020-07-09 ENCOUNTER — Emergency Department (HOSPITAL_COMMUNITY)
Admission: EM | Admit: 2020-07-09 | Discharge: 2020-07-10 | Disposition: A | Discharge status: Home / Self Care | Payer: Self-pay | Attending: Emergency Medicine | Admitting: Emergency Medicine

## 2020-07-09 ENCOUNTER — Encounter (HOSPITAL_COMMUNITY): Payer: Self-pay | Admitting: Emergency Medicine

## 2020-07-09 ENCOUNTER — Emergency Department (HOSPITAL_COMMUNITY): Payer: Self-pay

## 2020-07-09 DIAGNOSIS — F17210 Nicotine dependence, cigarettes, uncomplicated: Secondary | ICD-10-CM | POA: Insufficient documentation

## 2020-07-09 DIAGNOSIS — R079 Chest pain, unspecified: Secondary | ICD-10-CM

## 2020-07-09 DIAGNOSIS — R059 Cough, unspecified: Secondary | ICD-10-CM | POA: Insufficient documentation

## 2020-07-09 DIAGNOSIS — R0789 Other chest pain: Secondary | ICD-10-CM | POA: Insufficient documentation

## 2020-07-09 DIAGNOSIS — R509 Fever, unspecified: Secondary | ICD-10-CM | POA: Insufficient documentation

## 2020-07-09 DIAGNOSIS — R0981 Nasal congestion: Secondary | ICD-10-CM | POA: Insufficient documentation

## 2020-07-09 DIAGNOSIS — R451 Restlessness and agitation: Secondary | ICD-10-CM | POA: Insufficient documentation

## 2020-07-09 LAB — TROPONIN I (HIGH SENSITIVITY)
Troponin I (High Sensitivity): <2 ng/L (ref <18)
Troponin I (High Sensitivity): <2 ng/L (ref <18)

## 2020-07-09 LAB — COMPREHENSIVE METABOLIC PANEL
ALT: 14 U/L (ref 0–44)
AST: 19 U/L (ref 15–41)
Albumin: 3.9 g/dL (ref 3.5–5.0)
Alkaline Phosphatase: 62 U/L (ref 38–126)
Anion gap: 6 (ref 5–15)
BUN: 8 mg/dL (ref 6–20)
CO2: 25 mmol/L (ref 22–32)
Calcium: 8.9 mg/dL (ref 8.9–10.3)
Chloride: 110 mmol/L (ref 98–111)
Creatinine, Ser: 0.81 mg/dL (ref 0.44–1.00)
GFR, Estimated: >60 mL/min (ref >60)
Glucose, Bld: 121 mg/dL — ABNORMAL HIGH (ref 70–99)
Potassium: 3.7 mmol/L (ref 3.5–5.1)
Sodium: 141 mmol/L (ref 135–145)
Total Bilirubin: 0.6 mg/dL (ref 0.3–1.2)
Total Protein: 6.5 g/dL (ref 6.5–8.1)

## 2020-07-09 LAB — URINALYSIS, ROUTINE W REFLEX MICROSCOPIC
Bilirubin Urine: NEGATIVE
Glucose, UA: NEGATIVE mg/dL
Ketones, ur: NEGATIVE mg/dL
Nitrite: NEGATIVE
Protein, ur: 30 mg/dL — AB
Specific Gravity, Urine: 1.02 (ref 1.005–1.030)
pH: 7 (ref 5.0–8.0)

## 2020-07-09 LAB — CBC WITH DIFFERENTIAL/PLATELET
Abs Immature Granulocytes: 0.01 10*3/uL (ref 0.00–0.07)
Basophils Absolute: 0 10*3/uL (ref 0.0–0.1)
Basophils Relative: 1 %
Eosinophils Absolute: 0.3 10*3/uL (ref 0.0–0.5)
Eosinophils Relative: 6 %
HCT: 40.9 % (ref 36.0–46.0)
Hemoglobin: 13.3 g/dL (ref 12.0–15.0)
Immature Granulocytes: 0 %
Lymphocytes Relative: 29 %
Lymphs Abs: 1.5 10*3/uL (ref 0.7–4.0)
MCH: 27.4 pg (ref 26.0–34.0)
MCHC: 32.5 g/dL (ref 30.0–36.0)
MCV: 84.2 fL (ref 80.0–100.0)
Monocytes Absolute: 0.4 10*3/uL (ref 0.1–1.0)
Monocytes Relative: 8 %
Neutro Abs: 2.9 10*3/uL (ref 1.7–7.7)
Neutrophils Relative %: 56 %
Platelets: 249 10*3/uL (ref 150–400)
RBC: 4.86 MIL/uL (ref 3.87–5.11)
RDW: 14.4 % (ref 11.5–15.5)
WBC: 5.2 10*3/uL (ref 4.0–10.5)
nRBC: 0 % (ref 0.0–0.2)

## 2020-07-09 LAB — URINALYSIS, MICROSCOPIC (REFLEX)

## 2020-07-09 LAB — LIPASE, BLOOD: Lipase: 41 U/L (ref 11–51)

## 2020-07-09 LAB — I-STAT BETA HCG BLOOD, ED (MC, WL, AP ONLY): I-stat hCG, quantitative: <5 m[IU]/mL (ref <5)

## 2020-07-09 NOTE — ED Provider Notes (Signed)
Emergency Medicine Provider Triage Evaluation Note  Abigail Rosario 40 y.o. female was evaluated in triage.  Pt complains of chest pain.  She states that this is been ongoing for several weeks.  She states it feels like a tightness that goes up to her neck.  She states that she has also had some cough.  She has also reported some abdominal pain that has been ongoing for "a while."  She states that every once in a while, she feels like she has trouble swallowing and states that she has to force the food down.  She has had 1 episode of vomiting in the last couple weeks.  No tongue or lip swelling.  No fevers.  She has not urinary complaints.   Review of Systems  Positive: Cough, chest pain, abdominal pain Negative: Fevers, urinary complaints.  Physical Exam  BP 134/82   Pulse 70   Temp 98.2 F (36.8 C) (Oral)   Resp 18   Ht 5\' 4"  (1.626 m)   Wt 65.8 kg   SpO2 100%   BMI 24.89 kg/m  Gen:   Awake, no distress   HEENT:  Atraumatic.  Posterior oropharynx is clear.  No edema, erythema.  Uvula is midline.  Airways patent, phonation is intact. Resp:  Normal effort.  Able to speak in full sentences without any difficulty. Cardiac:  Normal rate  Abd:   Nondistended, diffuse tenderness palpation.  No rigidity, guarding. MSK:   Moves extremities without difficulty  Neuro:  Speech clear   Other:     Medical Decision Making  Medically screening exam initiated at 6:20 PM  Appropriate orders placed.  Abigail Rosario was informed that the remainder of the evaluation will be completed by another provider, this initial triage assessment does not replace that evaluation, and the importance of remaining in the ED until their evaluation is complete.   Clinical Impression  CP, ABd pain   Portions of this note were generated with Dragon dictation software. Dictation errors may occur despite best attempts at proofreading.     [name], PA-C 07/09/20 1821    09/08/20, MD 07/09/20  [phone]

## 2020-07-09 NOTE — ED Triage Notes (Signed)
PT c/o chest tightness that radiates to her neck, cough, and occasional episodes with difficulty swallowing.

## 2020-07-10 ENCOUNTER — Other Ambulatory Visit: Payer: Self-pay

## 2020-07-10 MED ORDER — PANTOPRAZOLE SODIUM 20 MG PO TBEC
40.0000 mg | DELAYED_RELEASE_TABLET | Freq: Every day | ORAL | 0 refills | Status: DC
  Filled 2020-07-10 – 2020-08-29 (×2): qty 28, 14d supply, fill #0

## 2020-07-10 NOTE — ED Provider Notes (Signed)
[facility] EMERGENCY DEPARTMENT Provider Note   CSN: [id number] Arrival date & time: 07/09/20  1809     History Chief Complaint  Patient presents with  . Chest Pain    Abigail Rosario is a 40 y.o. female.  HPI     40yo female with history of substance abuse, MDD, presents with concern for chest pain.  Reports chest pain present constantly for one month. Sharp pain in center of chest with radiation to her neck and also describes a tightness. Nothing seems to make it better or worse, it is not exertional or positional, not pleuritic.  It has been constant.  ALso feels that at times she has had difficulty swallowing but does not at this time.  For the last 2 weeks she has had some cough.  Symptoms of cough, congestion more noticible 2 weeks ago. Reports she has had fevers on ROS to me but not recently.  She is very anxious and upset about her emergency department wait and is upset regarding history taking.  She denies leg pain or swelling, recent travel or immobilization, denies current dyspnea.  .Smokes cigarettes   Past Medical History:  Diagnosis Date  . Drug abuse (HCC)   . Hepatitis C     Patient Active Problem List   Diagnosis Date Noted  . Ruptured globe, left eye 01/18/2020  . Substance use disorder 01/10/2020  . MDD (major depressive disorder) 01/09/2020    Past Surgical History:  Procedure Laterality Date  . RUPTURED GLOBE EXPLORATION AND REPAIR Left 01/18/2020   Procedure: REPAIR OF RUPTURED GLOBE;  Surgeon: Marcelline Deist, MD;  Location: [facility] OR;  Service: Ophthalmology;  Laterality: Left;     OB History   No obstetric history on file.     No family history on file.  Social History   Tobacco Use  . Smoking status: Current Every Day Smoker    Packs/day: 1.00    Years: 10.00    Pack years: 10.00  . Smokeless tobacco: Never Used  Vaping Use  . Vaping Use: Never used  Substance Use Topics  . Alcohol use: Yes  . Drug use: Yes     Types: Methamphetamines, Marijuana    Home Medications Prior to Admission medications   Medication Sig Start Date End Date Taking? Authorizing Provider  pantoprazole (PROTONIX) 20 MG tablet Take 2 tablets (40 mg total) by mouth daily for 14 days. 07/10/20 07/24/20 Yes Alvira Monday, MD  dorzolamide-timolol (COSOPT) 22.3-6.8 MG/ML ophthalmic solution Place 1 drop into the left eye 2 (two) times daily. 02/28/20   Liberty Handy, PA-C  escitalopram (LEXAPRO) 10 MG tablet Take 1 tablet (10 mg total) by mouth daily. 02/28/20   Liberty Handy, PA-C  gabapentin (NEURONTIN) 100 MG capsule Take 2 capsules (200 mg total) by mouth 3 (three) times daily. 02/28/20   Liberty Handy, PA-C  HYDROcodone-acetaminophen (NORCO/VICODIN) 5-325 MG tablet Take 1-2 tablets by mouth every 4 (four) hours as needed for moderate pain. 01/20/20   Mincey, Daisy Blossom, MD  ibuprofen (ADVIL) 200 MG tablet Take 600 mg by mouth every 6 (six) hours as needed for fever, headache or mild pain.    [provider]  ofloxacin (OCUFLOX) 0.3 % ophthalmic solution Place 1 drop into the left eye 4 (four) times daily. 02/28/20   Liberty Handy, PA-C  prednisoLONE acetate (PRED FORTE) 1 % ophthalmic suspension Place 1 drop into the left eye 4 (four) times daily. 02/28/20   Liberty Handy,  PA-C  QUEtiapine (SEROQUEL) 50 MG tablet Take 1 tablet (50 mg total) by mouth at bedtime. 02/28/20   Liberty Handy, PA-C    Allergies    Septra [sulfamethoxazole-trimethoprim]  Review of Systems   Review of Systems  Constitutional: Positive for fatigue. Negative for fever.  HENT: Negative for sore throat.   Eyes: Negative for visual disturbance.  Respiratory: Positive for cough. Negative for shortness of breath.   Cardiovascular: Positive for chest pain. Negative for leg swelling.  Gastrointestinal: Negative for abdominal pain, constipation, diarrhea, nausea and vomiting.  Genitourinary: Negative for difficulty  urinating.  Musculoskeletal: Negative for back pain and neck pain.  Skin: Negative for rash.  Neurological: Negative for syncope and headaches.    Physical Exam Updated Vital Signs BP 105/67   Pulse 72   Temp 98.2 F (36.8 C) (Oral)   Resp 16   SpO2 98%   Physical Exam Vitals and nursing note reviewed.  Constitutional:      General: She is not in acute distress.    Appearance: She is well-developed. She is not diaphoretic.     Comments: Anxious, agitated  HENT:     Head: Normocephalic and atraumatic.  Eyes:     Conjunctiva/sclera: Conjunctivae normal.  Cardiovascular:     Rate and Rhythm: Normal rate and regular rhythm.     Heart sounds: Normal heart sounds. No murmur heard. No friction rub. No gallop.   Pulmonary:     Effort: Pulmonary effort is normal. No respiratory distress.     Breath sounds: Normal breath sounds. No wheezing or rales.  Abdominal:     General: There is no distension.  Musculoskeletal:        General: No tenderness.     Cervical back: Normal range of motion.  Skin:    General: Skin is warm and dry.     Findings: No erythema or rash.  Neurological:     Mental Status: She is alert and oriented to person, place, and time.     ED Results / Procedures / Treatments   Labs (all labs ordered are listed, but only abnormal results are displayed) Labs Reviewed  COMPREHENSIVE METABOLIC PANEL - Abnormal; Notable for the following components:      Result Value   Glucose, Bld 121 (*)    All other components within normal limits  URINALYSIS, ROUTINE W REFLEX MICROSCOPIC - Abnormal; Notable for the following components:   APPearance HAZY (*)    Hgb urine dipstick LARGE (*)    Protein, ur 30 (*)    Leukocytes,Ua SMALL (*)    All other components within normal limits  URINALYSIS, MICROSCOPIC (REFLEX) - Abnormal; Notable for the following components:   Bacteria, UA MANY (*)    All other components within normal limits  CBC WITH DIFFERENTIAL/PLATELET   LIPASE, BLOOD  I-STAT BETA HCG BLOOD, ED ([facility], [facility], [facility] ONLY)  TROPONIN I (HIGH SENSITIVITY)  TROPONIN I (HIGH SENSITIVITY)    EKG EKG Interpretation  Date/Time:  Wednesday Jul 09 2020 18:25:33 EDT Ventricular Rate:  77 PR Interval:  144 QRS Duration: 88 QT Interval:  402 QTC Calculation: 454 R Axis:   76 Text Interpretation: Normal sinus rhythm Normal ECG Since prior ECG, TW inversion aVL new since prior Confirmed by Alvira Monday ([id number]) on 07/09/2020 11:09:09 PM   Radiology DG Chest 2 View  Result Date: 07/09/2020 CLINICAL DATA:  Chest pain shortness of breath EXAM: CHEST - 2 VIEW COMPARISON:  01/08/2020 FINDINGS: The heart size and mediastinal  contours are within normal limits. Both lungs are clear. The visualized skeletal structures are unremarkable. IMPRESSION: No active cardiopulmonary disease. Electronically Signed   By: Alcide Clever M.D.   On: 07/09/2020 18:56    Procedures Procedures   Medications Ordered in ED Medications - No data to display  ED Course  I have reviewed the triage vital signs and the nursing notes.  Pertinent labs & imaging results that were available during my care of the patient were reviewed by me and considered in my medical decision making (see chart for details).    MDM Rules/Calculators/A&P                          40yo female with history of substance abuse, MDD, presents with concern for chest pain.  Differential diagnosis for chest pain includes pulmonary embolus, dissection, pneumothorax, pneumonia, ACS, myocarditis, pericarditis.  EKG was done and evaluate by me and showed no acute ST changes and no signs of pericarditis-does show new single tw inversion.  Chest x-ray was done and evaluated by me and radiology and showed no sign of pneumonia or pneumothorax. Patient is PERC negative and low risk [place] and have low suspicion for PE.  Patient is low risk HEART score and had delta troponins which were both negative.  Do not feel history or  exam are consistent with aortic dissection given duration, normal bilateral upper and LE pulses. Not having symptoms consistent with food impaction.  Recommend follow up with PCP, treatment with protonix for possible GERD, and Cardiology follow up.  Discharged and left without receiving paperwork.    Final Clinical Impression(s) / ED Diagnoses Final diagnoses:  Chest pain, unspecified type    Rx / DC Orders ED Discharge Orders         Ordered    pantoprazole (PROTONIX) 20 MG tablet  Daily        07/10/20 0016           Alvira Monday, MD 07/10/20 1514

## 2020-07-10 NOTE — ED Notes (Signed)
Pt left ED treatment area prior to RN giving pt discharge instructions.

## 2020-07-10 NOTE — ED Notes (Signed)
Pt not in room. RN made aware.

## 2020-07-17 ENCOUNTER — Other Ambulatory Visit: Payer: Self-pay

## 2020-08-29 ENCOUNTER — Other Ambulatory Visit: Payer: Self-pay

## 2020-10-21 ENCOUNTER — Ambulatory Visit (HOSPITAL_COMMUNITY): Payer: Federal, State, Local not specified - Other | Admitting: Licensed Clinical Social Worker

## 2020-11-07 ENCOUNTER — Ambulatory Visit (HOSPITAL_COMMUNITY): Payer: Federal, State, Local not specified - Other | Admitting: Psychiatry

## 2021-01-06 ENCOUNTER — Emergency Department (HOSPITAL_COMMUNITY)
Admission: EM | Admit: 2021-01-06 | Discharge: 2021-01-07 | Disposition: A | Discharge status: Home / Self Care | Payer: Self-pay | Attending: Emergency Medicine | Admitting: Emergency Medicine

## 2021-01-06 ENCOUNTER — Emergency Department (HOSPITAL_COMMUNITY): Payer: Self-pay

## 2021-01-06 ENCOUNTER — Ambulatory Visit (HOSPITAL_COMMUNITY)
Admission: EM | Admit: 2021-01-06 | Discharge: 2021-01-06 | Disposition: A | Discharge status: Home / Self Care | Payer: No Typology Code available for payment source | Attending: Emergency Medicine | Admitting: Emergency Medicine

## 2021-01-06 DIAGNOSIS — Z0441 Encounter for examination and observation following alleged adult rape: Secondary | ICD-10-CM | POA: Diagnosis not present

## 2021-01-06 DIAGNOSIS — F17210 Nicotine dependence, cigarettes, uncomplicated: Secondary | ICD-10-CM | POA: Insufficient documentation

## 2021-01-06 DIAGNOSIS — R0689 Other abnormalities of breathing: Secondary | ICD-10-CM | POA: Insufficient documentation

## 2021-01-06 DIAGNOSIS — T1490XA Injury, unspecified, initial encounter: Secondary | ICD-10-CM

## 2021-01-06 DIAGNOSIS — M542 Cervicalgia: Secondary | ICD-10-CM | POA: Insufficient documentation

## 2021-01-06 DIAGNOSIS — R079 Chest pain, unspecified: Secondary | ICD-10-CM | POA: Insufficient documentation

## 2021-01-06 DIAGNOSIS — R102 Pelvic and perineal pain: Secondary | ICD-10-CM | POA: Insufficient documentation

## 2021-01-06 DIAGNOSIS — R55 Syncope and collapse: Secondary | ICD-10-CM | POA: Insufficient documentation

## 2021-01-06 DIAGNOSIS — M549 Dorsalgia, unspecified: Secondary | ICD-10-CM | POA: Insufficient documentation

## 2021-01-06 DIAGNOSIS — R519 Headache, unspecified: Secondary | ICD-10-CM | POA: Insufficient documentation

## 2021-01-06 DIAGNOSIS — H5442A3 Blindness left eye category 3, normal vision right eye: Secondary | ICD-10-CM | POA: Insufficient documentation

## 2021-01-06 LAB — MAGNESIUM: Magnesium: 2.3 mg/dL (ref 1.7–2.4)

## 2021-01-06 LAB — COMPREHENSIVE METABOLIC PANEL
ALT: 13 U/L (ref 0–44)
AST: 21 U/L (ref 15–41)
Albumin: 3.6 g/dL (ref 3.5–5.0)
Alkaline Phosphatase: 48 U/L (ref 38–126)
Anion gap: 10 (ref 5–15)
BUN: 12 mg/dL (ref 6–20)
CO2: 23 mmol/L (ref 22–32)
Calcium: 8.5 mg/dL — ABNORMAL LOW (ref 8.9–10.3)
Chloride: 105 mmol/L (ref 98–111)
Creatinine, Ser: 0.77 mg/dL (ref 0.44–1.00)
GFR, Estimated: >60 mL/min (ref >60)
Glucose, Bld: 99 mg/dL (ref 70–99)
Potassium: 3.4 mmol/L — ABNORMAL LOW (ref 3.5–5.1)
Sodium: 138 mmol/L (ref 135–145)
Total Bilirubin: 0.9 mg/dL (ref 0.3–1.2)
Total Protein: 5.8 g/dL — ABNORMAL LOW (ref 6.5–8.1)

## 2021-01-06 LAB — CBC WITH DIFFERENTIAL/PLATELET
Abs Immature Granulocytes: 0.02 10*3/uL (ref 0.00–0.07)
Basophils Absolute: 0 10*3/uL (ref 0.0–0.1)
Basophils Relative: 1 %
Eosinophils Absolute: 0.1 10*3/uL (ref 0.0–0.5)
Eosinophils Relative: 1 %
HCT: 36.1 % (ref 36.0–46.0)
Hemoglobin: 12.4 g/dL (ref 12.0–15.0)
Immature Granulocytes: 0 %
Lymphocytes Relative: 14 %
Lymphs Abs: 1.1 10*3/uL (ref 0.7–4.0)
MCH: 28.3 pg (ref 26.0–34.0)
MCHC: 34.3 g/dL (ref 30.0–36.0)
MCV: 82.4 fL (ref 80.0–100.0)
Monocytes Absolute: 0.3 10*3/uL (ref 0.1–1.0)
Monocytes Relative: 4 %
Neutro Abs: 6.2 10*3/uL (ref 1.7–7.7)
Neutrophils Relative %: 80 %
Platelets: 227 10*3/uL (ref 150–400)
RBC: 4.38 MIL/uL (ref 3.87–5.11)
RDW: 13.3 % (ref 11.5–15.5)
WBC: 7.7 10*3/uL (ref 4.0–10.5)
nRBC: 0 % (ref 0.0–0.2)

## 2021-01-06 LAB — LIPASE, BLOOD: Lipase: 37 U/L (ref 11–51)

## 2021-01-06 LAB — I-STAT BETA HCG BLOOD, ED (MC, WL, AP ONLY): I-stat hCG, quantitative: <5 m[IU]/mL (ref <5)

## 2021-01-06 MED ORDER — METRONIDAZOLE 500 MG PO TABS
2000.0000 mg | ORAL_TABLET | Freq: Once | ORAL | Status: DC
  Filled 2021-01-06: qty 4

## 2021-01-06 MED ORDER — LIDOCAINE HCL (PF) 1 % IJ SOLN: 1.0000 mL | Freq: Once | INTRAMUSCULAR | Status: DC

## 2021-01-06 MED ORDER — ELVITEG-COBIC-EMTRICIT-TENOFAF 150-150-200-10 MG PO TABS: 1.0000 | ORAL_TABLET | Freq: Every day | ORAL | 0 refills | Status: DC

## 2021-01-06 MED ORDER — ONDANSETRON HCL 4 MG/2ML IJ SOLN: 4.0000 mg | Freq: Four times a day (QID) | INTRAMUSCULAR | Status: DC | PRN

## 2021-01-06 MED ORDER — ACETAMINOPHEN 325 MG PO TABS
650.0000 mg | ORAL_TABLET | Freq: Once | ORAL | Status: DC
  Filled 2021-01-06: qty 2

## 2021-01-06 MED ORDER — AZITHROMYCIN 250 MG PO TABS
1000.0000 mg | ORAL_TABLET | Freq: Once | ORAL | Status: AC
  Administered 2021-01-07: 1000 mg via ORAL
  Filled 2021-01-06: qty 4

## 2021-01-06 MED ORDER — PROMETHAZINE HCL 25 MG PO TABS: 25.0000 mg | ORAL_TABLET | Freq: Four times a day (QID) | ORAL | Status: DC | PRN

## 2021-01-06 MED ORDER — ELVITEG-COBIC-EMTRICIT-TENOFAF 150-150-200-10 MG PREPACK
1.0000 | ORAL_TABLET | Freq: Once | ORAL | Status: AC
  Administered 2021-01-07: 1 via ORAL
  Filled 2021-01-06: qty 1

## 2021-01-06 MED ORDER — CEFTRIAXONE SODIUM 500 MG IJ SOLR
500.0000 mg | Freq: Once | INTRAMUSCULAR | Status: AC
  Administered 2021-01-07: 500 mg via INTRAMUSCULAR
  Filled 2021-01-06: qty 500

## 2021-01-06 MED ORDER — LACTATED RINGERS IV BOLUS
1000.0000 mL | Freq: Once | INTRAVENOUS | Status: AC
  Administered 2021-01-06: 1000 mL via INTRAVENOUS
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
## 2021-01-07 ENCOUNTER — Other Ambulatory Visit (HOSPITAL_COMMUNITY): Payer: Self-pay

## 2021-01-07 ENCOUNTER — Emergency Department (HOSPITAL_COMMUNITY): Payer: Self-pay

## 2021-01-07 LAB — URINALYSIS, ROUTINE W REFLEX MICROSCOPIC
Bilirubin Urine: NEGATIVE
Glucose, UA: NEGATIVE mg/dL
Hgb urine dipstick: NEGATIVE
Ketones, ur: 20 mg/dL — AB
Nitrite: NEGATIVE
Protein, ur: 30 mg/dL — AB
Specific Gravity, Urine: 1.023 (ref 1.005–1.030)
pH: 6 (ref 5.0–8.0)

## 2021-01-07 LAB — POC URINE PREG, ED: Preg Test, Ur: NEGATIVE

## 2021-01-07 LAB — HEPATITIS B SURFACE ANTIGEN: Hepatitis B Surface Ag: NONREACTIVE

## 2021-01-07 LAB — RAPID HIV SCREEN (HIV 1/2 AB+AG)
HIV 1/2 Antibodies: NONREACTIVE
HIV-1 P24 Antigen - HIV24: NONREACTIVE

## 2021-01-07 LAB — RPR: RPR Ser Ql: NONREACTIVE

## 2021-01-07 LAB — HEPATITIS C ANTIBODY: HCV Ab: REACTIVE — AB

## 2021-01-07 MED ORDER — STERILE WATER FOR INJECTION IJ SOLN
INTRAMUSCULAR | Status: AC
  Administered 2021-01-07: 1 mL
  Filled 2021-01-07: qty 10

## 2021-01-07 MED ORDER — ELVITEG-COBIC-EMTRICIT-TENOFAF 150-150-200-10 MG PO TABS
1.0000 | ORAL_TABLET | Freq: Every day | ORAL | 0 refills | Status: DC
  Filled 2021-01-07 (×2): qty 30, 30d supply, fill #0

## 2021-01-07 NOTE — ED Notes (Signed)
Patient transported to X-ray 
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
## 2021-01-07 NOTE — SANE Note (Signed)
A copay coupon was obtained for patient with the following information:  BIN: [id number] PCN: [id number] GROUP: [id number] MEMBER: [id number]
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
## 2021-01-07 NOTE — ED Notes (Signed)
Pt refusing to take the rest of her medication, pt advised that without the medications she is at risk for developing an infection and will not be covered against said infection. This RN offered the pt juice, apple sauce and soda to help with the "disgusting taste" but the pt continues to refuse.

## 2021-01-07 NOTE — ED Notes (Signed)
Patient was asked when she got discharge if she was wanting to go to a shelter that information was provided on or if she was wanting to leave on her own. Pt stated that she wanted to go to "the streets"

## 2021-01-07 NOTE — ED Notes (Signed)
Sane nurse notified of rapid HIV

## 2021-01-07 NOTE — Discharge Instructions (Addendum)
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 or law enforcement to find a location. If you cannot return the medication, ask your care team how to get rid of this medication safely. NOTE: This sheet is a summary. It may not cover all possible information. If you have questions about this medicine, talk to your doctor, pharmacist, or health care provider.  2022 Elsevier/Gold Standard (2020-03-25 09:56:16)  Elvitegravir; Cobicistat; Emtricitabine; Tenofovir Alafenamide oral tablets  What is this medication? ELVITEGRAVIR; COBICISTAT; EMTRICITABINE; TENOFOVIR ALAFENAMIDE (el vye TEG ra veer; koe BIS i stat; em tri SIT uh bean; te NOE fo veer) is 3 antiretroviral medicines and a medication booster in 1 tablet. It is used to treat HIV. This medicine is not a cure for HIV. This medicine can lower, but not fully prevent, the risk of spreading  HIV to others. This medicine may be used for other purposes; ask your health care provider or pharmacist if you have questions. COMMON BRAND NAME(S): Genvoya What should I tell my care team before I take this medication? They need to know if you have any of these conditions: kidney disease liver disease an unusual or allergic reaction to elvitegravir, cobicistat, emtricitabine, tenofovir, other medicines, foods, dyes, or preservatives pregnant or trying to get pregnant breast-feeding How should I use this medication? Take this medicine by mouth with a glass of water. Follow the directions on the prescription label. Take this medicine with food. Take your medicine at regular intervals. Do not take your medicine more often than directed. For your anti-HIV therapy to work as well as possible, take each dose exactly as prescribed. Do not skip doses or stop your medicine even if you feel better. Skipping doses may make the HIV virus resistant to this medicine and other medicines. Do not stop taking except on your doctor's advice. Talk to your pediatrician regarding the use of this medicine in children. While this drug may be prescribed for selected conditions, precautions do apply. Overdosage: If you think you have taken too much of this medicine contact a poison control center or emergency room at once. NOTE: This medicine is only for you. Do not share this medicine with others. What if I miss a dose? If you miss a dose, take it as soon as you can. If it is almost time for your next dose, take only that dose. Do not take double or extra doses. What may interact with this medication? Do not take this medicine with any of the following medications: adefovir alfuzosin certain medicines for seizures like carbamazepine, phenobarbital, phenytoin cisapride irinotecan lumacaftor; ivacaftor lurasidone medicines for cholesterol like lovastatin, simvastatin medicines for headaches like dihydroergotamine,  ergotamine, methylergonovine midazolam naloxegol other antiviral medicines for HIV or AIDS pimozide rifampin sildenafil [facility] John's wort triazolam This medicine may also interact with the following medications: antacids atorvastatin bosentan buprenorphine; naloxone certain antibiotics like clarithromycin, telithromycin, rifabutin, rifapentine certain medications for anxiety or sleep like buspirone, clorazepate, diazepam, estazolam, flurazepam, zolpidem certain medicines for blood pressure or heart disease like amlodipine, diltiazem, felodipine, metoprolol, nicardipine, nifedipine, timolol, verapamil certain medicines for depression, anxiety, or psychiatric disturbances certain medicines for erectile dysfunction like avanafil, sildenafil, tadalafil, vardenafil certain medicines for fungal infection like itraconazole, ketoconazole, voriconazole certain medicines that treat or prevent blood clots like warfarin, apixaban, betrixaban, dabigatran, edoxaban, and rivaroxaban colchicine cyclosporine female hormones, like estrogens and progestins and birth control pills medicines for infection like acyclovir, cidofovir, valacyclovir, ganciclovir, valganciclovir medicines for irregular heart beat like amiodarone, bepridil, digoxin, disopyramide, dofetilide, flecainide, lidocaine, mexiletine, propafenone, quinidine metformin oxcarbazepine phenothiazines like perphenazine, risperidone, thioridazine salmeterol sirolimus steroid medicines like betamethasone, budesonide, ciclesonide, dexamethasone, fluticasone, methylprednisolone, mometasone, triamcinolone tacrolimus This list may not describe all possible interactions. Give your health care provider a list of all the medicines, herbs, non-prescription drugs, or dietary supplements you use. Also tell them if you smoke, drink alcohol, or use illegal drugs. Some items may interact with  your medicine. What should I watch for while using this  medication? Visit your doctor or health care professional for regular check ups. Discuss any new symptoms with your doctor. You will need to have important blood work done while on this medicine. HIV is spread to others through sexual or blood contact. Talk to your doctor about how to stop the spread of HIV. If you have hepatitis B, talk to your doctor if you plan to stop this medicine. The symptoms of hepatitis B may get worse if you stop this medicine. Birth control pills may not work properly while you are taking this medicine. Talk to your doctor about using an extra method of birth control. Women who can still have children must use a reliable form of barrier contraception, like a condom. What side effects may I notice from receiving this medication? Side effects that you should report to your doctor or health care professional as soon as possible: allergic reactions like skin rash, itching or hives, swelling of the face, lips, or tongue breathing problems fast, irregular heartbeat muscle pain or weakness signs and symptoms of kidney injury like trouble passing urine or change in the amount of urine signs and symptoms of liver injury like dark yellow or brown urine; general ill feeling or flu-like symptoms; light-colored stools; loss of appetite; right upper belly pain; unusually weak or tired; yellowing of the eyes or skin Side effects that usually do not require medical attention (report to your doctor or health care professional if they continue or are bothersome): diarrhea headache nausea tiredness This list may not describe all possible side effects. Call your doctor for medical advice about side effects. You may report side effects to FDA at 1-800-FDA-1088. Where should I keep my medication? Keep out of the reach of children. Store at room temperature below 30 degrees C (86 degrees F). Throw away any unused medicine after the expiration date. NOTE: This sheet is a summary. It may not  cover all possible information. If you have questions about this medicine, talk to your doctor, pharmacist, or health care provider.  2022 Elsevier/Gold Standard (2019-01-16 17:54:51)

## 2021-01-07 NOTE — ED Notes (Signed)
Pt refusing d/c vitals.
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
## 2021-01-07 NOTE — ED Notes (Signed)
Pt provided a make a phone call with.

## 2021-01-07 NOTE — ED Notes (Signed)
Patient verbalizes understanding of discharge instructions. Opportunity for questioning and answers were provided. Armband removed by staff, pt discharged from ED ambulatory. Bus pass and cab voucher offered. Pt declined.

## 2021-01-31 ENCOUNTER — Telehealth: Payer: Medicaid Other | Admitting: Nurse Practitioner

## 2021-01-31 DIAGNOSIS — J40 Bronchitis, not specified as acute or chronic: Secondary | ICD-10-CM

## 2021-01-31 MED ORDER — PREDNISONE 20 MG PO TABS: 40.0000 mg | ORAL_TABLET | Freq: Every day | ORAL | 0 refills | Status: AC

## 2021-01-31 MED ORDER — BENZONATATE 100 MG PO CAPS: 100.0000 mg | ORAL_CAPSULE | Freq: Three times a day (TID) | ORAL | 0 refills | Status: DC | PRN

## 2021-01-31 MED ORDER — AZITHROMYCIN 250 MG PO TABS: ORAL_TABLET | ORAL | 0 refills | Status: DC

## 2021-01-31 MED ORDER — ALBUTEROL SULFATE HFA 108 (90 BASE) MCG/ACT IN AERS: 2.0000 | INHALATION_SPRAY | Freq: Four times a day (QID) | RESPIRATORY_TRACT | 0 refills | Status: DC | PRN

## 2021-01-31 NOTE — Progress Notes (Signed)
Virtual Visit Consent   Abigail Rosario, you are scheduled for a virtual visit with Mary-Margaret Daphine Deutscher, FNP, a [facility] provider, today.     Just as with appointments in the office, your consent must be obtained to participate.  Your consent will be active for this visit and any virtual visit you may have with one of our providers in the next 365 days.     If you have a MyChart account, a copy of this consent can be sent to you electronically.  All virtual visits are billed to your insurance company just like a traditional visit in the office.    As this is a virtual visit, video technology does not allow for your provider to perform a traditional examination.  This may limit your provider's ability to fully assess your condition.  If your provider identifies any concerns that need to be evaluated in person or the need to arrange testing (such as labs, EKG, etc.), we will make arrangements to do so.     Although advances in technology are sophisticated, we cannot ensure that it will always work on either your end or our end.  If the connection with a video visit is poor, the visit may have to be switched to a telephone visit.  With either a video or telephone visit, we are not always able to ensure that we have a secure connection.     I need to obtain your verbal consent now.   Are you willing to proceed with your visit today? YES   Kenleigh Toback has provided verbal consent on 01/31/2021 for a virtual visit (video or telephone).   Mary-Margaret Daphine Deutscher, FNP   Date: 01/31/2021 11:19 AM   Virtual Visit via Video Note   I, Mary-Margaret Daphine Deutscher, connected with Aleene Swanner ([id number], 1980/08/01) on 01/31/21 at 11:45 AM EST by a video-enabled telemedicine application and verified that I am speaking with the correct person using two identifiers.  Location: Patient: Virtual Visit Location Patient: Home Provider: Virtual Visit Location Provider: Mobile   I discussed the limitations  of evaluation and management by telemedicine and the availability of in person appointments. The patient expressed understanding and agreed to proceed.    History of Present Illness: Abigail Rosario is a 40 y.o. who identifies as a female who was assigned female at birth, and is being seen today for cough and congestion.Marland Kitchen  HPI: Patient has been sick for 2 weeks. Feels weak, body aches cough and congestion. Cough is productive and phlegm has gone from clear to green. She has been taking nyquil and that helps some. She is a smoker.   Review of Systems  Constitutional:  Positive for chills, fever (?) and malaise/fatigue.  HENT:  Positive for congestion and sore throat.   Respiratory:  Positive for cough, sputum production and wheezing. Negative for shortness of breath.   Musculoskeletal:  Positive for myalgias.  Neurological:  Positive for headaches.  All other systems reviewed and are negative.  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   Substance use disorder 01/10/2020   MDD (major depressive disorder) 01/09/2020    Allergies:  Allergies  Allergen Reactions   Septra [Sulfamethoxazole-Trimethoprim] Hives and Rash   Medications:  Current Outpatient Medications:    dorzolamide-timolol (COSOPT) 22.3-6.8 MG/ML ophthalmic solution, Place 1 drop into the left eye 2 (two) times daily., Disp: 10 mL, Rfl: 12   elvitegravir-cobicistat-emtricitabine-tenofovir (GENVOYA) 150-150-200-10 MG TABS tablet, Take 1  tablet by mouth daily with breakfast., Disp: 30 tablet, Rfl: 0   elvitegravir-cobicistat-emtricitabine-tenofovir (GENVOYA) 150-150-200-10 MG TABS tablet, Take 1 tablet by mouth daily with breakfast., Disp: 30 tablet, Rfl: 0   escitalopram (LEXAPRO) 10 MG tablet, Take 1 tablet (10 mg total) by mouth daily., Disp: 30 tablet, Rfl: 0   gabapentin (NEURONTIN) 100 MG capsule, Take 2 capsules (200 mg total) by mouth 3 (three) times daily.,  Disp: 180 capsule, Rfl: 0   HYDROcodone-acetaminophen (NORCO/VICODIN) 5-325 MG tablet, Take 1-2 tablets by mouth every 4 (four) hours as needed for moderate pain., Disp: 30 tablet, Rfl: 0   ibuprofen (ADVIL) 200 MG tablet, Take 600 mg by mouth every 6 (six) hours as needed for fever, headache or mild pain., Disp: , Rfl:    ofloxacin (OCUFLOX) 0.3 % ophthalmic solution, Place 1 drop into the left eye 4 (four) times daily., Disp: 5 mL, Rfl: 0   pantoprazole (PROTONIX) 20 MG tablet, Take 2 tablets (40 mg total) by mouth daily for 14 days., Disp: 28 tablet, Rfl: 0   prednisoLONE acetate (PRED FORTE) 1 % ophthalmic suspension, Place 1 drop into the left eye 4 (four) times daily., Disp: 5 mL, Rfl: 0   QUEtiapine (SEROQUEL) 50 MG tablet, Take 1 tablet (50 mg total) by mouth at bedtime., Disp: 30 tablet, Rfl: 0  Observations/Objective: Patient is well-developed, well-nourished in no acute distress.  Resting comfortably  at home.  Head is normocephalic, atraumatic.  No labored breathing.  Speech is clear and coherent with logical content.  Patient is alert and oriented at baseline.  Voice hoarse Deep wet cough noted  Assessment and Plan: [name] in today with chief complaint of No chief complaint on file.   1. Bronchitis 1. Take meds as prescribed 2. Use a cool mist humidifier especially during the winter months and when heat has been humid. 3. Use saline nose sprays frequently 4. Saline irrigations of the nose can be very helpful if done frequently.  * 4X daily for 1 week*  * Use of a nettie pot can be helpful with this. Follow directions with this* 5. Drink plenty of fluids 6. Keep thermostat turn down low 7.For any cough or congestion- tessalon perles 8. For fever or aces or pains- take tylenol or ibuprofen appropriate for age and weight.  * for fevers greater than 101 orally you may alternate ibuprofen and tylenol every  3 hours.    Meds ordered this encounter  Medications    azithromycin (ZITHROMAX Z-PAK) 250 MG tablet    Sig: As directed    Dispense:  6 tablet    Refill:  0    Order Specific Question:   Supervising Provider    Answer:   Hyacinth Meeker, BRIAN [[id number]]   predniSONE (DELTASONE) 20 MG tablet    Sig: Take 2 tablets (40 mg total) by mouth daily with breakfast for 5 days. 2 po daily for 5 days    Dispense:  10 tablet    Refill:  0    Order Specific Question:   Supervising Provider    Answer:   Hyacinth Meeker, BRIAN [[id number]]   albuterol (VENTOLIN HFA) 108 (90 Base) MCG/ACT inhaler    Sig: Inhale 2 puffs into the lungs every 6 (six) hours as needed for wheezing or shortness of breath.    Dispense:  8 g    Refill:  0    Order Specific Question:   Supervising Provider    Answer:   MILLER, BRIAN [[id number]]   benzonatate (  TESSALON PERLES) 100 MG capsule    Sig: Take 1 capsule (100 mg total) by mouth 3 (three) times daily as needed.    Dispense:  20 capsule    Refill:  0    Order Specific Question:   Supervising Provider    Answer:   Eber Hong [[id number]]      Follow Up Instructions: I discussed the assessment and treatment plan with the patient. The patient was provided an opportunity to ask questions and all were answered. The patient agreed with the plan and demonstrated an understanding of the instructions.  A copy of instructions were sent to the patient via MyChart.  The patient was advised to call back or seek an in-person evaluation if the symptoms worsen or if the condition fails to improve as anticipated.  Time:  I spent 10 minutes with the patient via telehealth technology discussing the above problems/concerns.    Mary-Margaret Daphine Deutscher, FNP

## 2021-01-31 NOTE — Patient Instructions (Signed)

## 2021-04-14 ENCOUNTER — Other Ambulatory Visit: Payer: Self-pay

## 2021-04-14 ENCOUNTER — Emergency Department (HOSPITAL_COMMUNITY): Payer: Self-pay

## 2021-04-14 ENCOUNTER — Encounter (HOSPITAL_COMMUNITY): Payer: Self-pay | Admitting: Emergency Medicine

## 2021-04-14 ENCOUNTER — Emergency Department (HOSPITAL_COMMUNITY)
Admission: EM | Admit: 2021-04-14 | Discharge: 2021-04-14 | Disposition: A | Discharge status: Home / Self Care | Payer: Self-pay | Attending: Emergency Medicine | Admitting: Emergency Medicine

## 2021-04-14 DIAGNOSIS — S62511A Displaced fracture of proximal phalanx of right thumb, initial encounter for closed fracture: Secondary | ICD-10-CM | POA: Insufficient documentation

## 2021-04-14 DIAGNOSIS — Z79899 Other long term (current) drug therapy: Secondary | ICD-10-CM | POA: Insufficient documentation

## 2021-04-14 MED ORDER — OXYCODONE-ACETAMINOPHEN 5-325 MG PO TABS: 1.0000 | ORAL_TABLET | Freq: Four times a day (QID) | ORAL | 0 refills | Status: AC | PRN

## 2021-04-14 MED ORDER — OXYCODONE-ACETAMINOPHEN 5-325 MG PO TABS
1.0000 | ORAL_TABLET | Freq: Once | ORAL | Status: AC
  Administered 2021-04-14: 1 via ORAL
  Filled 2021-04-14: qty 1
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
## 2021-04-14 NOTE — Discharge Instructions (Addendum)
Keep the splint intact clean and dry until you follow-up with the hand specialist.  Please contact their clinic to request follow-up appointment.  Recommend Tylenol and Motrin as needed for pain control.  For breakthrough pain can take the prescribed Percocet.  Note this contains dose of Tylenol.  Note this can make you drowsy and should not be taken while driving or operating heavy machinery.  Recommend no weightbearing in your right hand for now.
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
## 2021-04-28 ENCOUNTER — Other Ambulatory Visit: Payer: Self-pay

## 2021-04-28 MED ORDER — NICOTINE 14 MG/24HR TD PT24
MEDICATED_PATCH | TRANSDERMAL | 3 refills | Status: DC
  Filled 2021-04-28: qty 28, 28d supply, fill #0

## 2021-06-12 ENCOUNTER — Emergency Department (HOSPITAL_COMMUNITY)
Admission: EM | Admit: 2021-06-12 | Discharge: 2021-06-13 | Disposition: A | Discharge status: Home / Self Care | Payer: 59 | Attending: Emergency Medicine | Admitting: Emergency Medicine

## 2021-06-12 DIAGNOSIS — R197 Diarrhea, unspecified: Secondary | ICD-10-CM | POA: Diagnosis not present

## 2021-06-12 DIAGNOSIS — R110 Nausea: Secondary | ICD-10-CM | POA: Diagnosis not present

## 2021-06-12 DIAGNOSIS — Z5321 Procedure and treatment not carried out due to patient leaving prior to being seen by health care provider: Secondary | ICD-10-CM | POA: Diagnosis not present

## 2021-06-12 DIAGNOSIS — R109 Unspecified abdominal pain: Secondary | ICD-10-CM | POA: Insufficient documentation

## 2021-06-12 LAB — URINALYSIS, ROUTINE W REFLEX MICROSCOPIC
Bacteria, UA: NONE SEEN
Bilirubin Urine: NEGATIVE
Glucose, UA: NEGATIVE mg/dL
Hgb urine dipstick: NEGATIVE
Ketones, ur: 20 mg/dL — AB
Leukocytes,Ua: NEGATIVE
Nitrite: NEGATIVE
Protein, ur: 30 mg/dL — AB
Specific Gravity, Urine: 1.028 (ref 1.005–1.030)
pH: 5 (ref 5.0–8.0)

## 2021-06-12 LAB — COMPREHENSIVE METABOLIC PANEL
ALT: 12 U/L (ref 0–44)
AST: 19 U/L (ref 15–41)
Albumin: 4.7 g/dL (ref 3.5–5.0)
Alkaline Phosphatase: 54 U/L (ref 38–126)
Anion gap: 14 (ref 5–15)
BUN: 16 mg/dL (ref 6–20)
CO2: 17 mmol/L — ABNORMAL LOW (ref 22–32)
Calcium: 9.6 mg/dL (ref 8.9–10.3)
Chloride: 107 mmol/L (ref 98–111)
Creatinine, Ser: 1.03 mg/dL — ABNORMAL HIGH (ref 0.44–1.00)
GFR, Estimated: >60 mL/min (ref >60)
Glucose, Bld: 90 mg/dL (ref 70–99)
Potassium: 3.9 mmol/L (ref 3.5–5.1)
Sodium: 138 mmol/L (ref 135–145)
Total Bilirubin: 1 mg/dL (ref 0.3–1.2)
Total Protein: 7.4 g/dL (ref 6.5–8.1)

## 2021-06-12 LAB — LIPASE, BLOOD: Lipase: 35 U/L (ref 11–51)

## 2021-06-12 LAB — CBC
HCT: 43 % (ref 36.0–46.0)
Hemoglobin: 14.4 g/dL (ref 12.0–15.0)
MCH: 27.7 pg (ref 26.0–34.0)
MCHC: 33.5 g/dL (ref 30.0–36.0)
MCV: 82.9 fL (ref 80.0–100.0)
Platelets: 303 10*3/uL (ref 150–400)
RBC: 5.19 MIL/uL — ABNORMAL HIGH (ref 3.87–5.11)
RDW: 13.5 % (ref 11.5–15.5)
WBC: 12.1 10*3/uL — ABNORMAL HIGH (ref 4.0–10.5)
nRBC: 0 % (ref 0.0–0.2)

## 2021-06-12 LAB — RAPID URINE DRUG SCREEN, HOSP PERFORMED
Amphetamines: POSITIVE — AB
Barbiturates: NOT DETECTED
Benzodiazepines: NOT DETECTED
Cocaine: NOT DETECTED
Opiates: NOT DETECTED
Tetrahydrocannabinol: POSITIVE — AB

## 2021-06-12 LAB — I-STAT BETA HCG BLOOD, ED (MC, WL, AP ONLY): I-stat hCG, quantitative: <5 m[IU]/mL (ref <5)

## 2021-06-12 NOTE — ED Triage Notes (Signed)
Pt here with abdominal pain with back pain. Pt admitted to drug use pta. Pt fidgety. HR 123. BP palp 110.  ?

## 2021-06-12 NOTE — ED Provider Triage Note (Signed)
Emergency Medicine Provider Triage Evaluation Note ? ?Abigail Rosario , a 41 y.o. female  was evaluated in triage.  Pt with multiple complaints. She is complaining of abdominal pain radiating to her back x 2 weeks. With associated diarrhea and nausea. She admits to increased stress in her life including death of a child several months ago. She admits to using marijuana and meth today. She is also complaining of an "inside bump" in her vagina that is swollen and uncomfortable.  ? ?Review of Systems  ?Positive: As above ?Negative: Fever, vomiting ? ?Physical Exam  ?BP (!) 109/56 (BP Location: Right Arm)   Pulse 99   Temp 98.2 ?F (36.8 ?C)   Resp 16   SpO2 98%  ?Gen:   Awake, no distress   ?Resp:  Normal effort  ?MSK:   Moves extremities without difficulty  ?Other:  Tearful in triage ? ?Medical Decision Making  ?Medically screening exam initiated at 8:16 PM.  Appropriate orders placed.  [name] was informed that the remainder of the evaluation will be completed by another provider, this initial triage assessment does not replace that evaluation, and the importance of remaining in the ED until their evaluation is complete. ? ? ?  ?Kazuto Sevey T, PA-C ?06/12/21 2019 ? ?

## 2021-07-30 ENCOUNTER — Other Ambulatory Visit: Payer: Self-pay

## 2021-07-30 ENCOUNTER — Emergency Department (HOSPITAL_COMMUNITY): Payer: Self-pay

## 2021-07-30 ENCOUNTER — Emergency Department (HOSPITAL_COMMUNITY)
Admission: EM | Admit: 2021-07-30 | Discharge: 2021-07-30 | Disposition: A | Discharge status: Home / Self Care | Payer: Self-pay | Attending: Emergency Medicine | Admitting: Emergency Medicine

## 2021-07-30 ENCOUNTER — Encounter (HOSPITAL_COMMUNITY): Payer: Self-pay | Admitting: Emergency Medicine

## 2021-07-30 DIAGNOSIS — R0789 Other chest pain: Secondary | ICD-10-CM | POA: Insufficient documentation

## 2021-07-30 DIAGNOSIS — R1013 Epigastric pain: Secondary | ICD-10-CM | POA: Insufficient documentation

## 2021-07-30 LAB — BASIC METABOLIC PANEL
Anion gap: 9 (ref 5–15)
BUN: 12 mg/dL (ref 6–20)
CO2: 21 mmol/L — ABNORMAL LOW (ref 22–32)
Calcium: 8.8 mg/dL — ABNORMAL LOW (ref 8.9–10.3)
Chloride: 107 mmol/L (ref 98–111)
Creatinine, Ser: 0.83 mg/dL (ref 0.44–1.00)
GFR, Estimated: >60 mL/min (ref >60)
Glucose, Bld: 91 mg/dL (ref 70–99)
Potassium: 3.8 mmol/L (ref 3.5–5.1)
Sodium: 137 mmol/L (ref 135–145)

## 2021-07-30 LAB — CBC
HCT: 36.8 % (ref 36.0–46.0)
Hemoglobin: 12.9 g/dL (ref 12.0–15.0)
MCH: 28.9 pg (ref 26.0–34.0)
MCHC: 35.1 g/dL (ref 30.0–36.0)
MCV: 82.3 fL (ref 80.0–100.0)
Platelets: 169 10*3/uL (ref 150–400)
RBC: 4.47 MIL/uL (ref 3.87–5.11)
RDW: 13.2 % (ref 11.5–15.5)
WBC: 6.1 10*3/uL (ref 4.0–10.5)
nRBC: 0 % (ref 0.0–0.2)

## 2021-07-30 LAB — TROPONIN I (HIGH SENSITIVITY)
Troponin I (High Sensitivity): <2 ng/L (ref <18)
Troponin I (High Sensitivity): <2 ng/L (ref <18)

## 2021-07-30 LAB — I-STAT BETA HCG BLOOD, ED (MC, WL, AP ONLY): I-stat hCG, quantitative: <5 m[IU]/mL (ref <5)

## 2021-07-30 MED ORDER — HALOPERIDOL LACTATE 5 MG/ML IJ SOLN
5.0000 mg | Freq: Once | INTRAMUSCULAR | Status: AC
  Administered 2021-07-30: 5 mg via INTRAVENOUS
  Filled 2021-07-30: qty 1

## 2021-07-30 MED ORDER — LIDOCAINE VISCOUS HCL 2 % MT SOLN
15.0000 mL | Freq: Once | OROMUCOSAL | Status: AC
Start: 2021-07-30 — End: 2021-07-30
  Administered 2021-07-30: 15 mL via ORAL
  Filled 2021-07-30: qty 15

## 2021-07-30 MED ORDER — SODIUM CHLORIDE 0.9 % IV BOLUS
1000.0000 mL | Freq: Once | INTRAVENOUS | Status: AC
  Administered 2021-07-30: 1000 mL via INTRAVENOUS

## 2021-07-30 MED ORDER — ALUM & MAG HYDROXIDE-SIMETH 200-200-20 MG/5ML PO SUSP
30.0000 mL | Freq: Once | ORAL | Status: AC
Start: 2021-07-30 — End: 2021-07-30
  Administered 2021-07-30: 30 mL via ORAL
  Filled 2021-07-30: qty 30

## 2021-07-30 NOTE — ED Triage Notes (Addendum)
Pt BIB EMS from home for 6/10 central chest pain radiating to left shoulder. Pt admits to using meth to EMS. Pt reports last use was 2-3 days ago. 1 Nitro and 324 ASA given with no relief

## 2021-07-30 NOTE — ED Notes (Signed)
Pt rolling around in bed saying her chest hurts and she needs knocked out. Asked pt where he chest was hurting and she pointed to epigastric region and to RUQ.

## 2021-07-30 NOTE — ED Notes (Signed)
Pt in imaging at this time.

## 2021-07-30 NOTE — ED Notes (Signed)
Pt moaning and kicking legs around in the bed

## 2021-07-30 NOTE — ED Notes (Signed)
Pt back from X-ray.  

## 2021-07-30 NOTE — ED Notes (Signed)
Reviewed discharge instructions with patient. Follow-up care reviewed. Patient verbalized understanding. Patient A&Ox4, VSS, and ambulatory with steady gait upon discharge.  

## 2021-07-30 NOTE — ED Provider Notes (Signed)
[facility] EMERGENCY DEPARTMENT Provider Note   CSN: [id number] Arrival date & time: 07/30/21  [id number]     History  Chief Complaint  Patient presents with   Chest Pain    Abigail Rosario is a 41 y.o. female.  The history is provided by the patient and medical records. No language interpreter was used.  Chest Pain  41 year old female with significant history of polysubstance abuse including methamphetamine, hepatitis C, IV drug use brought here via EMS from home with complaints of chest pain.  Patient developed substernal chest pain that started last night while she was in bed.  She described pain as a constant pressure sensation rating across abdomen to her back.  Pain is intense, gradual onset not improved with taking ibuprofen.  No fever or shortness of breath nausea vomiting diarrhea constipation.  No recent trauma.  Admits to marijuana use, last meth use was several days prior.  Denies alcohol use.  Denies prior cardiac disease.  EMS initially gave patient 1 sublingual nitro as well as 224 mg of aspirin but patient report no relief.  Nothing seems to make the pain better or worse.  States she has had similar pain like this in the past but not this intense.     Home Medications Prior to Admission medications   Medication Sig Start Date End Date Taking? Authorizing Provider  albuterol (VENTOLIN HFA) 108 (90 Base) MCG/ACT inhaler Inhale 2 puffs into the lungs every 6 (six) hours as needed for wheezing or shortness of breath. 01/31/21   Chevis Pretty, FNP  azithromycin (ZITHROMAX Z-PAK) 250 MG tablet As directed 01/31/21   Hassell Done, Mary-Margaret, FNP  benzonatate (TESSALON PERLES) 100 MG capsule Take 1 capsule (100 mg total) by mouth 3 (three) times daily as needed. 01/31/21   Hassell Done, Mary-Margaret, FNP  dorzolamide-timolol (COSOPT) 22.3-6.8 MG/ML ophthalmic solution Place 1 drop into the left eye 2 (two) times daily. 02/28/20   Kinnie Feil, PA-C   elvitegravir-cobicistat-emtricitabine-tenofovir (GENVOYA) 150-150-200-10 MG TABS tablet Take 1 tablet by mouth daily with breakfast. 01/06/21   Godfrey Pick, MD  elvitegravir-cobicistat-emtricitabine-tenofovir (GENVOYA) 150-150-200-10 MG TABS tablet Take 1 tablet by mouth daily with breakfast. 01/06/21   Godfrey Pick, MD  escitalopram (LEXAPRO) 10 MG tablet Take 1 tablet (10 mg total) by mouth daily. 02/28/20   Kinnie Feil, PA-C  gabapentin (NEURONTIN) 100 MG capsule Take 2 capsules (200 mg total) by mouth 3 (three) times daily. 02/28/20   Kinnie Feil, PA-C  HYDROcodone-acetaminophen (NORCO/VICODIN) 5-325 MG tablet Take 1-2 tablets by mouth every 4 (four) hours as needed for moderate pain. 01/20/20   Mincey, Neena Rhymes, MD  ibuprofen (ADVIL) 200 MG tablet Take 600 mg by mouth every 6 (six) hours as needed for fever, headache or mild pain.    [provider]  nicotine (NICODERM CQ - DOSED IN MG/24 HOURS) 14 mg/24hr patch APPLY 1 patch to skin TransdermalLY Once a day 04/28/21     ofloxacin (OCUFLOX) 0.3 % ophthalmic solution Place 1 drop into the left eye 4 (four) times daily. 02/28/20   Kinnie Feil, PA-C  pantoprazole (PROTONIX) 20 MG tablet Take 2 tablets (40 mg total) by mouth daily for 14 days. 07/10/20 09/12/20  Gareth Morgan, MD  prednisoLONE acetate (PRED FORTE) 1 % ophthalmic suspension Place 1 drop into the left eye 4 (four) times daily. 02/28/20   Kinnie Feil, PA-C  QUEtiapine (SEROQUEL) 50 MG tablet Take 1 tablet (50 mg total) by mouth at bedtime. 02/28/20  Kinnie Feil, PA-C      Allergies    Septra [sulfamethoxazole-trimethoprim]    Review of Systems   Review of Systems  Cardiovascular:  Positive for chest pain.  All other systems reviewed and are negative.  Physical Exam Updated Vital Signs BP (!) 103/59 (BP Location: Right Arm)   Pulse 74   Temp 99 F (37.2 C) (Oral)   Resp (!) 22   Ht 5\' 7"  (1.702 m)   Wt 61.2 kg   SpO2 100%    BMI 21.13 kg/m  Physical Exam Vitals and nursing note reviewed.  Constitutional:      Appearance: She is well-developed.     Comments: Patient is writhing in bed appears uncomfortable.  HENT:     Head: Atraumatic.  Eyes:     Conjunctiva/sclera: Conjunctivae normal.  Cardiovascular:     Rate and Rhythm: Normal rate and regular rhythm.     Pulses: Normal pulses.     Heart sounds: Normal heart sounds.  Pulmonary:     Effort: Pulmonary effort is normal.     Breath sounds: No wheezing, rhonchi or rales.  Abdominal:     Palpations: Abdomen is soft.     Tenderness: There is abdominal tenderness (Tenderness to epigastric region no guarding or rebound tenderness). There is no right CVA tenderness or left CVA tenderness.  Musculoskeletal:     Cervical back: Neck supple.     Right lower leg: No edema.     Left lower leg: No edema.  Skin:    General: Skin is warm.     Capillary Refill: Capillary refill takes less than 2 seconds.     Findings: No rash.  Neurological:     Mental Status: She is alert. Mental status is at baseline.  Psychiatric:        Mood and Affect: Mood normal.    ED Results / Procedures / Treatments   Labs (all labs ordered are listed, but only abnormal results are displayed) Labs Reviewed - No data to display  EKG None  Radiology No results found.  Procedures Procedures    Medications Ordered in ED Medications - No data to display  ED Course/ Medical Decision Making/ A&P                           Medical Decision Making Amount and/or Complexity of Data Reviewed Labs: ordered. Radiology: ordered.  Risk OTC drugs. Prescription drug management.   BP (!) 103/59 (BP Location: Right Arm)   Pulse 74   Temp 99 F (37.2 C) (Oral)   Resp (!) 22   Ht 5\' 7"  (1.702 m)   Wt 61.2 kg   SpO2 100%   BMI 21.13 kg/m   7:51 AM This is a 41 year old female significant history of polysubstance use including meth and marijuana use presenting with  complaints of chest pain abdominal pain.  She did endorse pain to her epigastric/substernal region that started last night and have persisted throughout the day.  Pain is somewhat similar to pain that she has had in the past but more intense and radiates towards her back.  When the pain is intense she does endorse some mild shortness of breath but denies any significant cough fever or cold symptoms.  No nausea vomiting diarrhea.  She denies alcohol use.  On exam patient appears uncomfortable.  She does have some epigastric tenderness but no peritoneal sign.  She has intact distal pulses throughout and able  to move all 4 extremities without difficulty.  Her lungs are clear to auscultation.  Vital signs reviewed a soft blood pressure of 103/59 and increased respiratory rate of 22 likely secondary to pain and discomfort. She does not endorse any significant cardiac history.  Her hear score is 1 low risk of Mace.  Patient is PERC negative, low suspicion for PE.  Work-up initiated, will give GI cocktail for discomfort.  We will avoid opiate medication at this time due to low blood pressure, IV fluid given.  8:19 AM GI cocktail was given to the patient but she report no improvement of her symptoms.  Given her abdominal discomfort, and recent marijuana use, I have considered cannabinol hyperemesis syndrome as a potential cause of her discomfort, will give Haldol.  EKG independently viewed interpreted by me and no evidence of prolonged QT.  Furthermore, no concerning arrhythmia or ischemic changes.  12:37 PM Labs, EKG, and imaging independently viewed interpreted by me and agree with radiologist interpretation.  Patient has negative delta troponin, pregnancy test is negative, electrolyte panels are reassuring, normal WBC, normal H&H, chest x-ray unremarkable.  Patient was given Haldol with significant improvement of her symptoms.  She has been resting comfortably and at this time she is stable for discharge.   Recommend marijuana cessation as it may contribute to her ongoing symptoms.  This patient presents to the ED for concern of chest pain, this involves an extensive number of treatment options, and is a complaint that carries with it a high risk of complications and morbidity.  The differential diagnosis includes ACS, dissection, Cannabinoid Hyperemesis Syndrome, gastritis, PUD, GERD, MSK pain, PTX, PNA  Co morbidities that complicate the patient evaluation polysubstance use Additional history obtained:  Additional history obtained from EMS External records from outside source obtained and reviewed including prior ER visits along with imaging and labs  Lab Tests:  I Ordered, and personally interpreted labs.  The pertinent results include:  as above  Imaging Studies ordered:  I ordered imaging studies including CXR I independently visualized and interpreted imaging which showed no acute changes I agree with the radiologist interpretation  Cardiac Monitoring:  The patient was maintained on a cardiac monitor.  I personally viewed and interpreted the cardiac monitored which showed an underlying rhythm of: NSR  Medicines ordered and prescription drug management:  I ordered medication including haldol  for abd pain Reevaluation of the patient after these medicines showed that the patient improved I have reviewed the patients home medicines and have made adjustments as needed  Test Considered: abd/pelvis CT, but felt low yield  Critical Interventions: Haldol   Problem List / ED Course: abd pain  Reevaluation:  After the interventions noted above, I reevaluated the patient and found that they have :improved  Social Determinants of Health: drug use  Lack of PCP  Dispostion:  After consideration of the diagnostic results and the patients response to treatment, I feel that the patent would benefit from outpt f/u.         Final Clinical Impression(s) / ED  Diagnoses Final diagnoses:  Atypical chest pain    Rx / DC Orders ED Discharge Orders     None         Domenic Moras, PA-C 07/30/21 Verdi, Alvin Critchley, DO 07/30/21 1353

## 2021-07-31 ENCOUNTER — Emergency Department (HOSPITAL_COMMUNITY)
Admission: EM | Admit: 2021-07-31 | Discharge: 2021-07-31 | Discharge status: Against Medical Advice | Payer: Medicaid Other | Attending: Emergency Medicine | Admitting: Emergency Medicine

## 2021-07-31 ENCOUNTER — Encounter (HOSPITAL_COMMUNITY): Payer: Self-pay

## 2021-07-31 ENCOUNTER — Other Ambulatory Visit: Payer: Self-pay

## 2021-07-31 DIAGNOSIS — K146 Glossodynia: Secondary | ICD-10-CM | POA: Insufficient documentation

## 2021-07-31 MED ORDER — HYDROXYZINE HCL 25 MG PO TABS
25.0000 mg | ORAL_TABLET | Freq: Once | ORAL | Status: AC
  Administered 2021-07-31: 25 mg via ORAL
  Filled 2021-07-31: qty 1

## 2021-07-31 NOTE — ED Notes (Signed)
Patient yelling saying she can't breathe and refusing to swallow.

## 2021-07-31 NOTE — ED Notes (Addendum)
NA x4 for room 

## 2021-07-31 NOTE — ED Notes (Signed)
Patient able to swallow pill and water without difficulty

## 2021-07-31 NOTE — ED Provider Triage Note (Signed)
Emergency Medicine Provider Triage Evaluation Note  Abigail Rosario , a 41 y.o. female  was evaluated in triage.  Pt complains of tongue discomfort. Pt with hx of meth abuse and marijuana use last seen by me yesterday for sxs consistent with cannabinoid hyperemesis syndrome.  Report after leaving the ER she felt her tongue started hurting and she has a difficult time breathing and worries her tongue will fall off.    Review of Systems  Positive: As above Negative: As above  Physical Exam  BP 122/82 (BP Location: Right Arm)   Pulse 81   Temp 98.9 F (37.2 C) (Oral)   Resp 18   Ht 5\' 7"  (1.702 m)   Wt 60.8 kg   SpO2 100%   BMI 20.99 kg/m  Gen:   Awake, anxious Resp:  Normal effort  MSK:   Moves extremities without difficulty  Other:  Normal appearing tongue  Medical Decision Making  Medically screening exam initiated at 6:48 PM.  Appropriate orders placed.  Karime Scheuermann was informed that the remainder of the evaluation will be completed by another provider, this initial triage assessment does not replace that evaluation, and the importance of remaining in the ED until their evaluation is complete.  Substance induce psychosis   Angelena Sole, PA-C 07/31/21 1851

## 2021-07-31 NOTE — ED Triage Notes (Addendum)
Patient seen here yesterday for chest pain and was given haldol and reports her tongue hurts since leaving.  No swelling,, injury noted but tongue is discolored. Patient complains she can't breathe and is having a hard time swallowing. EMS reports patient will talk normal then have garbled speech and act like she can't swallow.

## 2022-03-09 ENCOUNTER — Ambulatory Visit: Payer: Medicaid Other | Admitting: Family Medicine

## 2022-03-09 ENCOUNTER — Telehealth: Payer: Self-pay

## 2022-03-09 NOTE — Telephone Encounter (Signed)
Outbound call placed to request fax number of patient's previous provider; patient was resting and will call back.

## 2022-03-23 ENCOUNTER — Encounter: Payer: Self-pay | Admitting: Family Medicine

## 2022-03-23 ENCOUNTER — Ambulatory Visit (INDEPENDENT_AMBULATORY_CARE_PROVIDER_SITE_OTHER): Payer: Medicaid Other | Admitting: Family Medicine

## 2022-03-23 VITALS — BP 112/70 | HR 66 | Temp 97.8°F | Ht 67.0 in | Wt 176.0 lb

## 2022-03-23 DIAGNOSIS — R7989 Other specified abnormal findings of blood chemistry: Secondary | ICD-10-CM

## 2022-03-23 DIAGNOSIS — Z1159 Encounter for screening for other viral diseases: Secondary | ICD-10-CM

## 2022-03-23 DIAGNOSIS — Z1231 Encounter for screening mammogram for malignant neoplasm of breast: Secondary | ICD-10-CM

## 2022-03-23 DIAGNOSIS — Z862 Personal history of diseases of the blood and blood-forming organs and certain disorders involving the immune mechanism: Secondary | ICD-10-CM

## 2022-03-23 DIAGNOSIS — Z113 Encounter for screening for infections with a predominantly sexual mode of transmission: Secondary | ICD-10-CM

## 2022-03-23 DIAGNOSIS — F17200 Nicotine dependence, unspecified, uncomplicated: Secondary | ICD-10-CM

## 2022-03-23 DIAGNOSIS — N888 Other specified noninflammatory disorders of cervix uteri: Secondary | ICD-10-CM

## 2022-03-23 DIAGNOSIS — Z0000 Encounter for general adult medical examination without abnormal findings: Secondary | ICD-10-CM

## 2022-03-23 DIAGNOSIS — Z124 Encounter for screening for malignant neoplasm of cervix: Secondary | ICD-10-CM

## 2022-03-23 DIAGNOSIS — Z0001 Encounter for general adult medical examination with abnormal findings: Secondary | ICD-10-CM | POA: Diagnosis not present

## 2022-03-23 DIAGNOSIS — Z114 Encounter for screening for human immunodeficiency virus [HIV]: Secondary | ICD-10-CM

## 2022-03-23 MED ORDER — NICOTINE 21 MG/24HR TD PT24: 21.0000 mg | MEDICATED_PATCH | Freq: Every day | TRANSDERMAL | 0 refills | Status: DC

## 2022-03-23 NOTE — Assessment & Plan Note (Signed)

## 2022-03-23 NOTE — Patient Instructions (Signed)
It was great to meet you today and I'm excited to have you join the [facility] practice. I hope you had a positive experience today! If you feel so inclined, please feel free to recommend our practice to friends and family. Dewitte Vannice, FNP-C  

## 2022-03-23 NOTE — Assessment & Plan Note (Addendum)
65mm cyst visualized on speculum exam. PAP with STI testing done today to rule out abnormal cervical cells. Will follow-up as indicated per results.

## 2022-03-23 NOTE — Progress Notes (Signed)
New Patient Office Visit  Subjective    Patient ID: Abigail Rosario, female    DOB: [birth year]  Age: 42 y.o. MRN: [id number]  CC:  Chief Complaint  Patient presents with   Establish Care    HPI Abigail Rosario presents to establish care. Oriented to practice routines and expectations. PMH includes anxiety, depression, bipolar, and PTSD. She sees a psychiatrist regularly at [facility] and feels she is well controlled on current regimen. Concerns today include a bump inside her vagina felt by her partner during sexual intercourse. She denies abnormal bleeding or discharge, abdominal pain, pain during intercourse.  Current Tobacco user, ready to quit, 0.5ppd Cervical cancer screening- done today Mammogram- ordered STI testing- done today Vaccines- declined    Outpatient Encounter Medications as of 03/23/2022  Medication Sig   gabapentin (NEURONTIN) 100 MG capsule Take 2 capsules (200 mg total) by mouth 3 (three) times daily.   ibuprofen (ADVIL) 200 MG tablet Take 600 mg by mouth every 6 (six) hours as needed for fever, headache or mild pain.   nicotine (NICODERM CQ - DOSED IN MG/24 HOURS) 21 mg/24hr patch Place 1 patch (21 mg total) onto the skin daily.   QUEtiapine (SEROQUEL) 50 MG tablet Take 1 tablet (50 mg total) by mouth at bedtime.   sertraline (ZOLOFT) 50 MG tablet Take 50 mg by mouth daily.   No facility-administered encounter medications on file as of 03/23/2022.    Past Medical History:  Diagnosis Date   Drug abuse ([place])    Hepatitis C     Past Surgical History:  Procedure Laterality Date   RUPTURED GLOBE EXPLORATION AND REPAIR Left 01/18/2020   Procedure: REPAIR OF RUPTURED GLOBE;  Surgeon: Awanda Mink, MD;  Location: [place];  Service: Ophthalmology;  Laterality: Left;    History reviewed. No pertinent family history.  Social History   Socioeconomic History   Marital status: Legally Separated    Spouse name: Not on file   Number of children: Not on  file   Years of education: Not on file   Highest education level: Not on file  Occupational History   Not on file  Tobacco Use   Smoking status: Every Day    Packs/day: 1.00    Years: 10.00    Total pack years: 10.00    Types: Cigarettes   Smokeless tobacco: Never  Vaping Use   Vaping Use: Never used  Substance and Sexual Activity   Alcohol use: Yes   Drug use: Yes    Types: Methamphetamines, Marijuana   Sexual activity: Not Currently  Other Topics Concern   Not on file  Social History Narrative   Not on file   Social Determinants of Health   Financial Resource Strain: Not on file  Food Insecurity: Not on file  Transportation Needs: Not on file  Physical Activity: Not on file  Stress: Not on file  Social Connections: Not on file  Intimate Partner Violence: Not on file    Review of Systems  Constitutional: Negative.   HENT: Negative.    Eyes: Negative.   Respiratory: Negative.    Cardiovascular: Negative.   Gastrointestinal: Negative.   Genitourinary: Negative.   Musculoskeletal: Negative.   Skin: Negative.   Neurological: Negative.   Endo/Heme/Allergies: Negative.   Psychiatric/Behavioral: Negative.    All other systems reviewed and are negative.       Objective    BP 112/70   Pulse 66   Temp 97.8 F (36.6 C) (Oral)  Ht 5\' 7"  (1.702 m)   Wt 176 lb (79.8 kg)   LMP 03/15/2022 (Approximate)   SpO2 96%   BMI 27.57 kg/m   Physical Exam Vitals and nursing note reviewed. Exam conducted with a chaperone present.  Constitutional:      Appearance: Normal appearance. She is normal weight.  HENT:     Head: Normocephalic and atraumatic.     Right Ear: Tympanic membrane, ear canal and external ear normal.     Left Ear: Tympanic membrane, ear canal and external ear normal.     Nose: Nose normal.     Mouth/Throat:     Mouth: Mucous membranes are moist.     Pharynx: Oropharynx is clear.  Eyes:     Extraocular Movements: Extraocular movements intact.      Conjunctiva/sclera: Conjunctivae normal.     Pupils: Pupils are equal, round, and reactive to light.  Cardiovascular:     Rate and Rhythm: Normal rate and regular rhythm.     Pulses: Normal pulses.     Heart sounds: Normal heart sounds.  Pulmonary:     Effort: Pulmonary effort is normal.     Breath sounds: Normal breath sounds.  Abdominal:     General: Bowel sounds are normal.     Palpations: Abdomen is soft.     Tenderness: There is abdominal tenderness in the right lower quadrant.  Genitourinary:    General: Normal vulva.     Vagina: Normal.     Cervix: Lesion present.     Uterus: Normal.      Adnexa: Right adnexa normal and left adnexa normal.     Rectum: Normal.     Comments: 45mm cyst Musculoskeletal:        General: Normal range of motion.     Cervical back: Normal range of motion and neck supple.  Skin:    General: Skin is warm and dry.     Capillary Refill: Capillary refill takes less than 2 seconds.  Neurological:     General: No focal deficit present.     Mental Status: She is alert and oriented to person, place, and time. Mental status is at baseline.  Psychiatric:        Mood and Affect: Mood normal.        Behavior: Behavior normal.        Thought Content: Thought content normal.        Judgment: Judgment normal.         Assessment & Plan:   Problem List Items Addressed This Visit       Genitourinary   Nabothian cyst    8mm cyst visualized on speculum exam. PAP with STI testing done today to rule out abnormal cervical cells. Will follow-up as indicated per results.        Other   Physical exam, annual - Primary    Today your medical history was reviewed and routine physical exam with labs was performed. Recommend 150 minutes of moderate intensity exercise weekly and consuming a well-balanced diet. Advised to stop smoking if a smoker, avoid smoking if a non-smoker, limit alcohol consumption to 1 drink per day for women and 2 drinks per day for men,  and avoid illicit drug use. Counseled on safe sex practices and offered STI testing today. Counseled on the importance of sunscreen use. Counseled in mental health awareness and when to seek medical care. Vaccine maintenance discussed. Appropriate health maintenance items reviewed. Return to office in 1 year for annual physical  exam.       Relevant Orders   CBC with Differential/Platelet   COMPLETE METABOLIC PANEL WITH GFR   TSH   Lipid panel   Other Visit Diagnoses     Encounter for screening mammogram for malignant neoplasm of breast       Relevant Orders   MM DIGITAL SCREENING BILATERAL   Screening for HIV (human immunodeficiency virus)       Relevant Orders   HIV Antibody (routine testing w rflx)   Need for hepatitis C screening test       Relevant Orders   Hepatitis C antibody   Cervical cancer screening       Relevant Orders   Pap, TP Imaging w/ CT/GC and w/ HPV RNA, rflx HPV Type 16/18   SureSwab Advanced Vaginitis, TMA   Tobacco dependence       Relevant Medications   nicotine (NICODERM CQ - DOSED IN MG/24 HOURS) 21 mg/24hr patch   History of anemia due to vitamin B12 deficiency       Relevant Orders   Vitamin B12   Routine screening for STI (sexually transmitted infection)           Return in about 1 year (around 03/24/2023) for annual physical.   Rubie Maid, FNP

## 2022-03-24 ENCOUNTER — Encounter: Payer: Self-pay | Admitting: Family Medicine

## 2022-03-24 ENCOUNTER — Other Ambulatory Visit: Payer: Commercial Managed Care - HMO

## 2022-03-24 LAB — SURESWAB® ADVANCED VAGINITIS,TMA
CANDIDA SPECIES: NOT DETECTED
Candida glabrata: DETECTED — AB
SURESWAB(R) ADV BACTERIAL VAGINOSIS(BV),TMA: NEGATIVE
TRICHOMONAS VAGINALIS (TV),TMA: NOT DETECTED

## 2022-03-25 ENCOUNTER — Other Ambulatory Visit: Payer: Self-pay | Admitting: Family Medicine

## 2022-03-25 DIAGNOSIS — R7989 Other specified abnormal findings of blood chemistry: Secondary | ICD-10-CM

## 2022-03-25 LAB — PAP, TP IMAGING W/ HPV RNA, RFLX HPV TYPE 16,18/45: HPV DNA High Risk: NOT DETECTED

## 2022-03-25 LAB — C. TRACHOMATIS/N. GONORRHOEAE RNA
C. trachomatis RNA, TMA: NOT DETECTED
N. gonorrhoeae RNA, TMA: NOT DETECTED

## 2022-03-25 LAB — PAP, TP IMAGING W/ CT/GC AND W/ HPV RNA, RFLX HPV TYPE 16/18

## 2022-03-25 NOTE — Addendum Note (Signed)
Addended by: Rubie Maid on: 03/25/2022 11:05 AM   Modules accepted: Orders

## 2022-03-26 ENCOUNTER — Encounter: Payer: Self-pay | Admitting: Family Medicine

## 2022-03-27 LAB — LIPID PANEL
Cholesterol: 179 mg/dL (ref <200)
HDL: 66 mg/dL (ref >=50)
LDL Cholesterol (Calc): 94 mg/dL (calc)
Non-HDL Cholesterol (Calc): 113 mg/dL (calc) (ref <130)
Total CHOL/HDL Ratio: 2.7 (calc) (ref <5.0)
Triglycerides: 92 mg/dL (ref <150)

## 2022-03-27 LAB — CBC WITH DIFFERENTIAL/PLATELET
Absolute Monocytes: 357 cells/uL (ref 200–950)
Basophils Absolute: 70 cells/uL (ref 0–200)
Basophils Relative: 1 %
Eosinophils Absolute: 567 cells/uL — ABNORMAL HIGH (ref 15–500)
Eosinophils Relative: 8.1 %
HCT: 43.7 % (ref 35.0–45.0)
Hemoglobin: 14.6 g/dL (ref 11.7–15.5)
Lymphs Abs: 1477 cells/uL (ref 850–3900)
MCH: 28.2 pg (ref 27.0–33.0)
MCHC: 33.4 g/dL (ref 32.0–36.0)
MCV: 84.5 fL (ref 80.0–100.0)
MPV: 10.7 fL (ref 7.5–12.5)
Monocytes Relative: 5.1 %
Neutro Abs: 4529 cells/uL (ref 1500–7800)
Neutrophils Relative %: 64.7 %
Platelets: 275 10*3/uL (ref 140–400)
RBC: 5.17 10*6/uL — ABNORMAL HIGH (ref 3.80–5.10)
RDW: 13 % (ref 11.0–15.0)
Total Lymphocyte: 21.1 %
WBC: 7 10*3/uL (ref 3.8–10.8)

## 2022-03-27 LAB — COMPLETE METABOLIC PANEL WITH GFR
AG Ratio: 2.4 (calc) (ref 1.0–2.5)
ALT: 12 U/L (ref 6–29)
AST: 16 U/L (ref 10–30)
Albumin: 4.7 g/dL (ref 3.6–5.1)
Alkaline phosphatase (APISO): 60 U/L (ref 31–125)
BUN: 10 mg/dL (ref 7–25)
CO2: 24 mmol/L (ref 20–32)
Calcium: 9.4 mg/dL (ref 8.6–10.2)
Chloride: 105 mmol/L (ref 98–110)
Creat: 0.82 mg/dL (ref 0.50–0.99)
Globulin: 2 g/dL (calc) (ref 1.9–3.7)
Glucose, Bld: 87 mg/dL (ref 65–99)
Potassium: 4.2 mmol/L (ref 3.5–5.3)
Sodium: 140 mmol/L (ref 135–146)
Total Bilirubin: 0.5 mg/dL (ref 0.2–1.2)
Total Protein: 6.7 g/dL (ref 6.1–8.1)
eGFR: 93 mL/min/{1.73_m2} (ref >=60)

## 2022-03-27 LAB — VITAMIN B12: Vitamin B-12: 358 pg/mL (ref 200–1100)

## 2022-03-27 LAB — TEST AUTHORIZATION

## 2022-03-27 LAB — HEPATITIS C ANTIBODY: Hepatitis C Ab: REACTIVE — AB

## 2022-03-27 LAB — TSH: TSH: 11.13 mIU/L — ABNORMAL HIGH

## 2022-03-27 LAB — HCV RNA,QUANTITATIVE REAL TIME PCR
HCV Quantitative Log: <1.18 Log IU/mL
HCV RNA, PCR, QN: <15 IU/mL

## 2022-03-27 LAB — HIV ANTIBODY (ROUTINE TESTING W REFLEX): HIV 1&2 Ab, 4th Generation: NONREACTIVE

## 2022-03-27 LAB — T3, FREE: T3, Free: 3.3 pg/mL (ref 2.3–4.2)

## 2022-03-27 LAB — T4, FREE: Free T4: 0.9 ng/dL (ref 0.8–1.8)

## 2022-03-29 ENCOUNTER — Telehealth: Payer: Self-pay | Admitting: Family Medicine

## 2022-03-29 ENCOUNTER — Other Ambulatory Visit: Payer: Self-pay | Admitting: Family Medicine

## 2022-03-29 MED ORDER — FLUCONAZOLE 150 MG PO TABS: 150.0000 mg | ORAL_TABLET | Freq: Once | ORAL | 0 refills | Status: AC

## 2022-03-29 MED ORDER — LEVOTHYROXINE SODIUM 25 MCG PO TABS: 25.0000 ug | ORAL_TABLET | Freq: Every day | ORAL | 0 refills | Status: DC

## 2022-03-29 NOTE — Telephone Encounter (Signed)
Spoke with patient to discuss lab results. Will start synthroid 1mcg daily for subclinical hypothyroidism and follow up in 4-6 weeks. Also treating vulvovaginal candidiasis with Diflucan 150mg  once.

## 2022-04-01 ENCOUNTER — Ambulatory Visit: Admission: RE | Admit: 2022-04-01 | Payer: Medicaid Other | Source: Ambulatory Visit

## 2022-05-13 ENCOUNTER — Other Ambulatory Visit (HOSPITAL_COMMUNITY): Payer: Self-pay

## 2022-05-13 ENCOUNTER — Other Ambulatory Visit: Payer: Self-pay | Admitting: Family Medicine

## 2022-05-13 ENCOUNTER — Encounter: Payer: Self-pay | Admitting: Family Medicine

## 2022-05-13 ENCOUNTER — Ambulatory Visit (INDEPENDENT_AMBULATORY_CARE_PROVIDER_SITE_OTHER): Payer: Medicaid Other | Admitting: Family Medicine

## 2022-05-13 VITALS — BP 115/82 | HR 72 | Temp 97.6°F | Ht 67.0 in | Wt 185.0 lb

## 2022-05-13 DIAGNOSIS — F938 Other childhood emotional disorders: Secondary | ICD-10-CM | POA: Insufficient documentation

## 2022-05-13 DIAGNOSIS — F429 Obsessive-compulsive disorder, unspecified: Secondary | ICD-10-CM | POA: Insufficient documentation

## 2022-05-13 DIAGNOSIS — E663 Overweight: Secondary | ICD-10-CM | POA: Diagnosis not present

## 2022-05-13 DIAGNOSIS — F3111 Bipolar disorder, current episode manic without psychotic features, mild: Secondary | ICD-10-CM | POA: Insufficient documentation

## 2022-05-13 DIAGNOSIS — F4310 Post-traumatic stress disorder, unspecified: Secondary | ICD-10-CM | POA: Insufficient documentation

## 2022-05-13 DIAGNOSIS — F32A Depression, unspecified: Secondary | ICD-10-CM | POA: Insufficient documentation

## 2022-05-13 LAB — TSH: TSH: 2.6 mIU/L

## 2022-05-13 MED ORDER — SERTRALINE HCL 50 MG PO TABS
50.0000 mg | ORAL_TABLET | Freq: Every day | ORAL | 3 refills | Status: AC
  Filled 2022-05-13: qty 30, 30d supply, fill #0

## 2022-05-13 NOTE — Progress Notes (Signed)
   Acute Office Visit  Subjective:     Patient ID: Abigail Rosario, female    DOB: 01/05/1981, 42 y.o.   MRN: [id number]  Chief Complaint  Patient presents with   Follow-up    Wt loss?    HPI Patient is in today for weight loss. Her current BMI is 28.98 and she states she is feeling miserable. She has tried increasing her fruits and vegetables. Has not tried calorie counting, exercise, or medications before. Symptoms include heaviness breathing with activity. Denies snoring, chest pain, shortness of breath, wheezing. Her goal weight is 140lbs.  Review of Systems  All other systems reviewed and are negative.       Objective:    BP 115/82   Pulse 72   Temp 97.6 F (36.4 C) (Oral)   Ht 5\' 7"  (1.702 m)   Wt 185 lb (83.9 kg)   LMP 05/11/2022 (Approximate)   SpO2 95%   BMI 28.98 kg/m  BP Readings from Last 3 Encounters:  05/13/22 115/82  03/23/22 112/70  07/31/21 122/82      Physical Exam Vitals and nursing note reviewed.  Constitutional:      Appearance: Normal appearance. She is overweight.  HENT:     Head: Normocephalic and atraumatic.  Skin:    General: Skin is warm and dry.  Neurological:     General: No focal deficit present.     Mental Status: She is alert and oriented to person, place, and time. Mental status is at baseline.  Psychiatric:        Mood and Affect: Mood normal.        Behavior: Behavior normal.        Thought Content: Thought content normal.        Judgment: Judgment normal.     No results found for any visits on 05/13/22.      Assessment & Plan:   Problem List Items Addressed This Visit       Other   Overweight (BMI 25.0-29.9) - Primary    BMI 28.98. Has not tried lifestyle modifications in past. Counseled on importance of calorie reduced diet, encouraged use of an app such as MyFitnessPal, and importance of exercise at least 150 minutes moderate intensity weekly. Will also place referral to medical weight management.       Relevant Orders   TSH   Amb Ref to Medical Weight Management    No orders of the defined types were placed in this encounter.   Return if symptoms worsen or fail to improve.  Rubie Maid, FNP

## 2022-05-13 NOTE — Assessment & Plan Note (Signed)
BMI 28.98. Has not tried lifestyle modifications in past. Counseled on importance of calorie reduced diet, encouraged use of an app such as MyFitnessPal, and importance of exercise at least 150 minutes moderate intensity weekly. Will also place referral to medical weight management.

## 2022-05-14 ENCOUNTER — Other Ambulatory Visit: Payer: Self-pay

## 2022-05-14 ENCOUNTER — Other Ambulatory Visit (HOSPITAL_COMMUNITY): Payer: Self-pay

## 2022-05-14 ENCOUNTER — Encounter (HOSPITAL_COMMUNITY): Payer: Self-pay

## 2022-05-18 ENCOUNTER — Other Ambulatory Visit: Payer: Self-pay | Admitting: Family Medicine

## 2022-05-20 ENCOUNTER — Other Ambulatory Visit: Payer: Self-pay

## 2022-06-15 ENCOUNTER — Ambulatory Visit
Admission: RE | Admit: 2022-06-15 | Discharge: 2022-06-15 | Disposition: A | Discharge status: Home / Self Care | Payer: Medicaid Other | Source: Ambulatory Visit | Attending: Family Medicine | Admitting: Family Medicine

## 2022-06-15 DIAGNOSIS — Z1231 Encounter for screening mammogram for malignant neoplasm of breast: Secondary | ICD-10-CM

## 2022-06-29 ENCOUNTER — Other Ambulatory Visit: Payer: Self-pay | Admitting: Family Medicine

## 2022-07-07 ENCOUNTER — Encounter: Payer: Self-pay | Admitting: Family Medicine

## 2022-09-29 ENCOUNTER — Encounter: Payer: Self-pay | Admitting: Family Medicine

## 2022-09-29 ENCOUNTER — Ambulatory Visit: Payer: MEDICAID | Admitting: Family Medicine

## 2022-09-29 VITALS — BP 124/74 | HR 84 | Temp 98.7°F | Ht 67.0 in | Wt 194.0 lb

## 2022-09-29 DIAGNOSIS — E6609 Other obesity due to excess calories: Secondary | ICD-10-CM

## 2022-09-29 DIAGNOSIS — F332 Major depressive disorder, recurrent severe without psychotic features: Secondary | ICD-10-CM

## 2022-09-29 DIAGNOSIS — Z6830 Body mass index (BMI) 30.0-30.9, adult: Secondary | ICD-10-CM | POA: Diagnosis not present

## 2022-09-29 DIAGNOSIS — E66811 Other obesity due to excess calories: Secondary | ICD-10-CM | POA: Insufficient documentation

## 2022-09-29 NOTE — Assessment & Plan Note (Signed)
Counseled on importance of weight management for overall health. Encouraged reduced calorie diet of 1200-1500 calories daily. I strongly encouraged her to start an exercise regimen for her overall mental and physical health. She does have an authorized referral to [facility] and I provided her with their contact information to schedule an appointment.

## 2022-09-29 NOTE — Assessment & Plan Note (Signed)
Abigail Rosario and I had a lengthy discussion about her depression and treatment options. I encouraged her to keep appointments with her therapist and group therapy groups. I also encouraged her to start healthy eating and exercise routine. She is going to verify her medications and dosages and report back to my office so I can make appropriate adjustments.

## 2022-09-29 NOTE — Progress Notes (Signed)
Subjective:  HPI: Abigail Rosario is a 42 y.o. female presenting on 09/29/2022 for Follow-up (excessive weight gain; constipation, med management - [facility]\\\)   HPI Patient is in today for concerns about her weight gain. She reports worsening depression, not wanting to get out of bed most days, and eating more for comfort. She would like to try a medication to help curb her appetite. She is seeing a behavioral health specialist who has made some adjustments to her medications and she is seeing them next Tuesday. She is not sure what medications have been added or what doses she is on of her current medications. She denies suicidal ideations or thoughts of self harm.  Review of Systems  All other systems reviewed and are negative.   Relevant past medical history reviewed and updated as indicated.   Past Medical History:  Diagnosis Date   Drug abuse (HCC)    Hepatitis C      Past Surgical History:  Procedure Laterality Date   RUPTURED GLOBE EXPLORATION AND REPAIR Left 01/18/2020   Procedure: REPAIR OF RUPTURED GLOBE;  Surgeon: Abigail Deist, MD;  Location: [facility];  Service: Ophthalmology;  Laterality: Left;    Allergies and medications reviewed and updated.   Current Outpatient Medications:    gabapentin (NEURONTIN) 100 MG capsule, Take 2 capsules (200 mg total) by mouth 3 (three) times daily., Disp: 180 capsule, Rfl: 0   ibuprofen (ADVIL) 200 MG tablet, Take 600 mg by mouth every 6 (six) hours as needed for fever, headache or mild pain., Disp: , Rfl:    levothyroxine (SYNTHROID) 25 MCG tablet, TAKE 1 TABLET(25 MCG) BY MOUTH DAILY, Disp: 90 tablet, Rfl: 0   nicotine (NICODERM CQ - DOSED IN MG/24 HOURS) 14 mg/24hr patch, Place 1 patch (14 mg total) onto the skin daily., Disp: 14 patch, Rfl: 0   QUEtiapine (SEROQUEL) 50 MG tablet, Take 1 tablet (50 mg total) by mouth at bedtime., Disp: 30 tablet, Rfl: 0   sertraline (ZOLOFT) 50 MG tablet, Take 1 tablet (50 mg total) by mouth  daily., Disp: 30 tablet, Rfl: 3  Allergies  Allergen Reactions   Septra [Sulfamethoxazole-Trimethoprim] Hives and Rash    Objective:   BP 124/74   Pulse 84   Temp 98.7 F (37.1 C) (Oral)   Ht 5\' 7"  (1.702 m)   Wt 194 lb (88 kg)   LMP  (Approximate)   SpO2 98%   BMI 30.38 kg/m      09/29/2022    2:16 PM 05/13/2022   11:13 AM 03/23/2022    2:02 PM  Vitals with BMI  Height 5\' 7"  5\' 7"  5\' 7"   Weight 194 lbs 185 lbs 176 lbs  BMI 30.38 28.97 27.56  Systolic 124 115 409  Diastolic 74 82 70  Pulse 84 72 66     Physical Exam Vitals and nursing note reviewed.  Constitutional:      Appearance: Normal appearance. She is normal weight.  HENT:     Head: Normocephalic and atraumatic.  Skin:    General: Skin is warm and dry.  Neurological:     General: No focal deficit present.     Mental Status: She is alert and oriented to person, place, and time. Mental status is at baseline.  Psychiatric:        Mood and Affect: Mood is depressed. Affect is tearful.        Behavior: Behavior normal.        Thought Content: Thought content normal.  Judgment: Judgment normal.     Assessment & Plan:  Severe episode of recurrent major depressive disorder, without psychotic features Abigail Rosario) Assessment & Plan: [name] and I had a lengthy discussion about her depression and treatment options. I encouraged her to keep appointments with her therapist and group therapy groups. I also encouraged her to start healthy eating and exercise routine. She is going to verify her medications and dosages and report back to my office so I can make appropriate adjustments.   Class 1 obesity due to excess calories without serious comorbidity with body mass index (BMI) of 30.0 to 30.9 in adult Assessment & Plan: Counseled on importance of weight management for overall health. Encouraged reduced calorie diet of 1200-1500 calories daily. I strongly encouraged her to start an exercise regimen for her overall  mental and physical health. She does have an authorized referral to [facility] and I provided her with their contact information to schedule an appointment.       Follow up plan: Return in about 3 months (around 12/30/2022).  Abigail Meo, FNP

## 2022-09-30 ENCOUNTER — Encounter (INDEPENDENT_AMBULATORY_CARE_PROVIDER_SITE_OTHER): Payer: Self-pay

## 2022-10-06 ENCOUNTER — Encounter: Payer: Self-pay | Admitting: Family Medicine

## 2022-11-09 ENCOUNTER — Ambulatory Visit: Payer: MEDICAID | Admitting: Family Medicine

## 2022-11-10 ENCOUNTER — Encounter: Payer: Self-pay | Admitting: Family Medicine

## 2022-11-10 ENCOUNTER — Ambulatory Visit (INDEPENDENT_AMBULATORY_CARE_PROVIDER_SITE_OTHER): Payer: No Typology Code available for payment source | Admitting: Family Medicine

## 2022-11-10 VITALS — BP 118/76 | HR 74 | Temp 98.4°F | Ht 67.0 in | Wt 196.0 lb

## 2022-11-10 DIAGNOSIS — Z113 Encounter for screening for infections with a predominantly sexual mode of transmission: Secondary | ICD-10-CM

## 2022-11-10 DIAGNOSIS — L02422 Furuncle of left axilla: Secondary | ICD-10-CM | POA: Diagnosis not present

## 2022-11-10 DIAGNOSIS — L739 Follicular disorder, unspecified: Secondary | ICD-10-CM | POA: Insufficient documentation

## 2022-11-10 MED ORDER — DOXYCYCLINE HYCLATE 100 MG PO TABS: 100.0000 mg | ORAL_TABLET | Freq: Two times a day (BID) | ORAL | 0 refills | Status: AC

## 2022-11-10 NOTE — Assessment & Plan Note (Signed)
Patient reports recent unprotected sex with new partner and would like STD testing. No known STD in her partner but she is not certain. She denies new GU symptoms or rash.

## 2022-11-10 NOTE — Progress Notes (Signed)
Subjective:  HPI: Abigail Rosario is a 42 y.o. female presenting on 11/10/2022 for Cyst (Pt states she found a huge knot under arm pit, 2 knots. Burning, swollen, under both arms. New deodorant. Patient asking about possible STD testing. )   HPI Patient is in today for 2 knots under her left armpit and 1 on the right that have been present for 1 week. This has not happened before. They are red and painful, not draining. She has had no fever, nausea, vomiting. There is tenderness in the surrounding left armpit.  She has tried Ibuprofen  Review of Systems  All other systems reviewed and are negative.   Relevant past medical history reviewed and updated as indicated.   Past Medical History:  Diagnosis Date   Drug abuse (HCC)    Hepatitis C      Past Surgical History:  Procedure Laterality Date   RUPTURED GLOBE EXPLORATION AND REPAIR Left 01/18/2020   Procedure: REPAIR OF RUPTURED GLOBE;  Surgeon: Marcelline Deist, MD;  Location: [facility] OR;  Service: Ophthalmology;  Laterality: Left;    Allergies and medications reviewed and updated.   Current Outpatient Medications:    doxycycline (VIBRA-TABS) 100 MG tablet, Take 1 tablet (100 mg total) by mouth 2 (two) times daily for 7 days., Disp: 14 tablet, Rfl: 0   gabapentin (NEURONTIN) 100 MG capsule, Take 2 capsules (200 mg total) by mouth 3 (three) times daily., Disp: 180 capsule, Rfl: 0   ibuprofen (ADVIL) 200 MG tablet, Take 600 mg by mouth every 6 (six) hours as needed for fever, headache or mild pain., Disp: , Rfl:    levothyroxine (SYNTHROID) 25 MCG tablet, TAKE 1 TABLET(25 MCG) BY MOUTH DAILY, Disp: 90 tablet, Rfl: 0   nicotine (NICODERM CQ - DOSED IN MG/24 HOURS) 14 mg/24hr patch, Place 1 patch (14 mg total) onto the skin daily., Disp: 14 patch, Rfl: 0   QUEtiapine (SEROQUEL) 50 MG tablet, Take 1 tablet (50 mg total) by mouth at bedtime., Disp: 30 tablet, Rfl: 0   sertraline (ZOLOFT) 50 MG tablet, Take 1 tablet (50 mg total) by  mouth daily., Disp: 30 tablet, Rfl: 3  Allergies  Allergen Reactions   Septra [Sulfamethoxazole-Trimethoprim] Hives and Rash    Objective:   BP 118/76   Pulse 74   Temp 98.4 F (36.9 C) (Oral)   Ht 5\' 7"  (1.702 m)   Wt 196 lb (88.9 kg)   SpO2 96%   BMI 30.70 kg/m      11/10/2022   11:42 AM 09/29/2022    2:16 PM 05/13/2022   11:13 AM  Vitals with BMI  Height 5\' 7"  5\' 7"  5\' 7"   Weight 196 lbs 194 lbs 185 lbs  BMI 30.69 30.38 28.97  Systolic 118 124 270  Diastolic 76 74 82  Pulse 74 84 72     Physical Exam Vitals and nursing note reviewed.  Constitutional:      Appearance: Normal appearance. She is normal weight.  HENT:     Head: Normocephalic and atraumatic.  Skin:    General: Skin is warm and dry.     Findings: Abscess and erythema present.     Comments: Several painful, erythematous, non-draining nodules with central follicle in bilateral axilla. One 0.5cm painful, erythematous nodule to left axilla is largest.  Neurological:     General: No focal deficit present.     Mental Status: She is alert and oriented to person, place, and time. Mental status is at baseline.  Psychiatric:        Mood and Affect: Mood normal.        Behavior: Behavior normal.        Thought Content: Thought content normal.        Judgment: Judgment normal.     Assessment & Plan:  Routine screening for STI (sexually transmitted infection) Assessment & Plan: Patient reports recent unprotected sex with new partner and would like STD testing. No known STD in her partner but she is not certain. She denies new GU symptoms or rash.  Orders: -     C. trachomatis/N. gonorrhoeae RNA -     HIV Antibody (routine testing w rflx) -     RPR -     Trichomonas vaginalis, RNA  Furuncle of left axilla Assessment & Plan: Patient has infected furuncle to her left axilla. Is it not spontaneously draining and is very painful, and tender to even light touch. She would not like it drained at this time.  Will start doxycycline 100mg  BID for 7 days and encouraged to use warm compresses to encourage spontaneous drainage as well as Tylenol or Ibuprofen for pain relief. Return to office if not resolved with antibiotics or if redness and warmth spreads.   Other orders -     Doxycycline Hyclate; Take 1 tablet (100 mg total) by mouth 2 (two) times daily for 7 days.  Dispense: 14 tablet; Refill: 0     Follow up plan: No follow-ups on file.  Park Meo, FNP

## 2022-11-10 NOTE — Assessment & Plan Note (Signed)
Patient has infected furuncle to her left axilla. Is it not spontaneously draining and is very painful, and tender to even light touch. She would not like it drained at this time. Will start doxycycline 100mg  BID for 7 days and encouraged to use warm compresses to encourage spontaneous drainage as well as Tylenol or Ibuprofen for pain relief. Return to office if not resolved with antibiotics or if redness and warmth spreads.

## 2022-11-11 LAB — C. TRACHOMATIS/N. GONORRHOEAE RNA
C. trachomatis RNA, TMA: NOT DETECTED
N. gonorrhoeae RNA, TMA: NOT DETECTED

## 2022-11-11 LAB — HIV ANTIBODY (ROUTINE TESTING W REFLEX): HIV 1&2 Ab, 4th Generation: NONREACTIVE

## 2022-11-11 LAB — TRICHOMONAS VAGINALIS, PROBE AMP: Trichomonas vaginalis RNA: NOT DETECTED

## 2022-11-11 LAB — RPR: RPR Ser Ql: NONREACTIVE

## 2022-11-22 ENCOUNTER — Encounter: Payer: Self-pay | Admitting: Family Medicine

## 2022-11-22 ENCOUNTER — Ambulatory Visit (INDEPENDENT_AMBULATORY_CARE_PROVIDER_SITE_OTHER): Payer: No Typology Code available for payment source | Admitting: Family Medicine

## 2022-11-22 VITALS — BP 106/59 | HR 64 | Temp 98.4°F | Ht 65.5 in | Wt 194.0 lb

## 2022-11-22 DIAGNOSIS — E6609 Other obesity due to excess calories: Secondary | ICD-10-CM

## 2022-11-22 DIAGNOSIS — F1990 Other psychoactive substance use, unspecified, uncomplicated: Secondary | ICD-10-CM | POA: Diagnosis not present

## 2022-11-22 DIAGNOSIS — F3111 Bipolar disorder, current episode manic without psychotic features, mild: Secondary | ICD-10-CM

## 2022-11-22 DIAGNOSIS — Z6831 Body mass index (BMI) 31.0-31.9, adult: Secondary | ICD-10-CM | POA: Diagnosis not present

## 2022-11-22 NOTE — Assessment & Plan Note (Signed)
Managed by psych, patient is currently on Zoloft 50 mg once daily and Seroquel 50 mg once daily.  She has seen weight gain since starting her mood stabilizer.  She reports a stable mood with a fair support system.  We discussed the importance of treating underlying mood disorder when actively working on weight reduction. Keep junk food triggers out of the house.  Continue outside counseling. Look for insulin resistance related to use of Seroquel with fasting labs next visit.  Consider use of metformin.

## 2022-11-22 NOTE — Assessment & Plan Note (Signed)
Reviewed weight history and patient goals along with program information.  Patient is interested in antiobesity medications.  Her insurance does not cover use of GLP-1 receptor agonist making them cost prohibitive.  Will plan to update fasting labs and IC next visit.  She is not a candidate for any antiobesity medications that would cause changes in mood.  With her history of substance abuse, avoid use of phentermine and Qsymia.  We discussed the importance of implementing behavior change, a plan for physical activity, nutritional change as cornerstones of her treatment plan.

## 2022-11-22 NOTE — Assessment & Plan Note (Signed)
Weight gain after substance abuse in recovery is common.  She has felt an increase in hunger and food cravings while in recovery.  This has caused more recent weight gain.  In addition, use of mood stabilizers has also contribute to weight gain and food cravings.  Continue counseling and mental health visits.  Will be focusing on eating on the schedule along with lean protein and fiber intake with all meals.

## 2022-11-22 NOTE — Progress Notes (Signed)
Office: [phone]  /  Fax: [phone]   Initial Visit  Abigail Rosario was seen in clinic today to evaluate for obesity. She is interested in losing weight to improve overall health and reduce the risk of weight related complications. She presents today to review program treatment options, initial physical assessment, and evaluation.     She was referred by: PCP  When asked what else they would like to accomplish? She states: Improve appearance and Improve self-confidence  Weight history:  has gained weight over the past 9 mos.  She would like to get to 140 lb.  When asked how has your weight affected you? She states: Having fatigue, Problems with eating patterns, and Has affected mood   Some associated conditions: None  Contributing factors: Nutritional, Medications, Stress, Reduced physical activity, and Mental health problems  Weight promoting medications identified: Psychotropic medications  Current nutrition plan: None  Current level of physical activity: None  Current or previous pharmacotherapy: None  Response to medication: Never tried medications   Past medical history includes:   Past Medical History:  Diagnosis Date   Drug abuse (HCC)    Hepatitis C      Objective:   BP (!) 106/59   Pulse 64   Temp 98.4 F (36.9 C)   Ht 5' 5.5" (1.664 m)   Wt 194 lb (88 kg)   SpO2 100%   BMI 31.79 kg/m  She was weighed on the bioimpedance scale: Body mass index is 31.79 kg/m.  Peak Weight:194 , Body Fat%:38.1, Visceral Fat Rating:8, Weight trend over the last 12 months: Increasing  General:  Alert, oriented and cooperative. Patient is in no acute distress.  Respiratory: Normal respiratory effort, no problems with respiration noted   Gait: able to ambulate independently  Mental Status: Normal mood and affect. Normal behavior. Normal judgment and thought content.   DIAGNOSTIC DATA REVIEWED:  BMET    Component Value Date/Time   NA 140 03/24/2022 0905   K  4.2 03/24/2022 0905   CL 105 03/24/2022 0905   CO2 24 03/24/2022 0905   GLUCOSE 87 03/24/2022 0905   BUN 10 03/24/2022 0905   CREATININE 0.82 03/24/2022 0905   CALCIUM 9.4 03/24/2022 0905   GFRNONAA >60 07/30/2021 0702   No results found for: "HGBA1C" No results found for: "INSULIN" CBC    Component Value Date/Time   WBC 7.0 03/24/2022 0905   RBC 5.17 (H) 03/24/2022 0905   HGB 14.6 03/24/2022 0905   HCT 43.7 03/24/2022 0905   PLT 275 03/24/2022 0905   MCV 84.5 03/24/2022 0905   MCH 28.2 03/24/2022 0905   MCHC 33.4 03/24/2022 0905   RDW 13.0 03/24/2022 0905   Iron/TIBC/Ferritin/ %Sat No results found for: "IRON", "TIBC", "FERRITIN", "IRONPCTSAT" Lipid Panel     Component Value Date/Time   CHOL 179 03/24/2022 0905   TRIG 92 03/24/2022 0905   HDL 66 03/24/2022 0905   CHOLHDL 2.7 03/24/2022 0905   LDLCALC 94 03/24/2022 0905   Hepatic Function Panel     Component Value Date/Time   PROT 6.7 03/24/2022 0905   ALBUMIN 4.7 06/12/2021 2043   AST 16 03/24/2022 0905   ALT 12 03/24/2022 0905   ALKPHOS 54 06/12/2021 2043   BILITOT 0.5 03/24/2022 0905      Component Value Date/Time   TSH 2.60 05/13/2022 1139     Assessment and Plan:   Bipolar 1 disorder, manic, mild (HCC) Assessment & Plan: Managed by psych, patient is currently on Zoloft 50 mg  once daily and Seroquel 50 mg once daily.  She has seen weight gain since starting her mood stabilizer.  She reports a stable mood with a fair support system.  We discussed the importance of treating underlying mood disorder when actively working on weight reduction. Keep junk food triggers out of the house.  Continue outside counseling. Look for insulin resistance related to use of Seroquel with fasting labs next visit.  Consider use of metformin.   Class 1 obesity due to excess calories with body mass index (BMI) of 31.0 to 31.9 in adult, unspecified whether serious comorbidity present  Substance use disorder Assessment &  Plan: Weight gain after substance abuse in recovery is common.  She has felt an increase in hunger and food cravings while in recovery.  This has caused more recent weight gain.  In addition, use of mood stabilizers has also contribute to weight gain and food cravings.  Continue counseling and mental health visits.  Will be focusing on eating on the schedule along with lean protein and fiber intake with all meals.   Class 1 obesity due to excess calories without serious comorbidity with body mass index (BMI) of 30.0 to 30.9 in adult Assessment & Plan: Reviewed weight history and patient goals along with program information.  Patient is interested in antiobesity medications.  Her insurance does not cover use of GLP-1 receptor agonist making them cost prohibitive.  Will plan to update fasting labs and IC next visit.  She is not a candidate for any antiobesity medications that would cause changes in mood.  With her history of substance abuse, avoid use of phentermine and Qsymia.  We discussed the importance of implementing behavior change, a plan for physical activity, nutritional change as cornerstones of her treatment plan.         Obesity Treatment / Action Plan:  Patient will work on garnering support from family and friends to begin weight loss journey. Will work on eliminating or reducing the presence of highly palatable, calorie dense foods in the home. Will complete provided nutritional and psychosocial assessment questionnaire before the next appointment. Will be scheduled for indirect calorimetry to determine resting energy expenditure in a fasting state.  This will allow [place] to create a reduced calorie, high-protein meal plan to promote loss of fat mass while preserving muscle mass. Will think about ideas on how to incorporate physical activity into their daily routine. Counseled on the health benefits of losing 5%-15% of total body weight. Was counseled on nutritional approaches to  weight loss and benefits of reducing processed foods and consuming plant-based foods and high quality protein as part of nutritional weight management. Was counseled on pharmacotherapy and role as an adjunct in weight management.   Obesity Education Performed Today:  She was weighed on the bioimpedance scale and results were discussed and documented in the synopsis.  We discussed obesity as a disease and the importance of a more detailed evaluation of all the factors contributing to the disease.  We discussed the importance of long term lifestyle changes which include nutrition, exercise and behavioral modifications as well as the importance of customizing this to her specific health and social needs.  We discussed the benefits of reaching a healthier weight to alleviate the symptoms of existing conditions and reduce the risks of the biomechanical, metabolic and psychological effects of obesity.  Arly Blee appears to be in the action stage of change and states they are ready to start intensive lifestyle modifications and behavioral modifications.  30 minutes was spent today on this visit including the above counseling, pre-visit chart review, and post-visit documentation.  Reviewed by clinician on day of visit: allergies, medications, problem list, medical history, surgical history, family history, social history, and previous encounter notes pertinent to obesity diagnosis.    Seymour Bars, D.O. DABFM, DABOM [facility] [phone] [place], [place] [address] [phone]

## 2022-11-24 ENCOUNTER — Ambulatory Visit: Payer: MEDICAID

## 2022-11-24 ENCOUNTER — Other Ambulatory Visit (INDEPENDENT_AMBULATORY_CARE_PROVIDER_SITE_OTHER): Payer: MEDICAID

## 2022-11-24 DIAGNOSIS — Z23 Encounter for immunization: Secondary | ICD-10-CM

## 2023-01-14 ENCOUNTER — Telehealth: Payer: No Typology Code available for payment source | Admitting: Physician Assistant

## 2023-01-14 DIAGNOSIS — J208 Acute bronchitis due to other specified organisms: Secondary | ICD-10-CM

## 2023-01-14 DIAGNOSIS — B9689 Other specified bacterial agents as the cause of diseases classified elsewhere: Secondary | ICD-10-CM

## 2023-01-14 MED ORDER — AZITHROMYCIN 250 MG PO TABS
ORAL_TABLET | ORAL | 0 refills | Status: DC
Start: 2023-01-14 — End: 2023-01-14

## 2023-01-14 MED ORDER — PREDNISONE 20 MG PO TABS: 40.0000 mg | ORAL_TABLET | Freq: Every day | ORAL | 0 refills | Status: DC

## 2023-01-14 MED ORDER — ALBUTEROL SULFATE HFA 108 (90 BASE) MCG/ACT IN AERS
1.0000 | INHALATION_SPRAY | Freq: Four times a day (QID) | RESPIRATORY_TRACT | 0 refills | Status: DC | PRN
Start: 2023-01-14 — End: 2024-01-17

## 2023-01-14 MED ORDER — PROMETHAZINE-DM 6.25-15 MG/5ML PO SYRP
5.0000 mL | ORAL_SOLUTION | Freq: Four times a day (QID) | ORAL | 0 refills | Status: DC | PRN
Start: 2023-01-14 — End: 2023-02-24

## 2023-01-14 MED ORDER — DOXYCYCLINE HYCLATE 100 MG PO TABS
100.0000 mg | ORAL_TABLET | Freq: Two times a day (BID) | ORAL | 0 refills | Status: DC
Start: 2023-01-14 — End: 2023-02-24

## 2023-01-14 NOTE — Patient Instructions (Signed)
Abigail Rosario, thank you for joining Abigail Loveless, PA-C for today's virtual visit.  While this provider is not your primary care provider (PCP), if your PCP is located in our provider database this encounter information will be shared with them immediately following your visit.   A [facility] MyChart account gives you access to today's visit and all your visits, tests, and labs performed at [facility] " click here if you don't have a  MyChart account or go to [email or web address]  Consent: (Patient) Abigail Rosario provided verbal consent for this virtual visit at the beginning of the encounter.  Current Medications:  Current Outpatient Medications:    albuterol (VENTOLIN HFA) 108 (90 Base) MCG/ACT inhaler, Inhale 1-2 puffs into the lungs every 6 (six) hours as needed., Disp: 8 g, Rfl: 0   doxycycline (VIBRA-TABS) 100 MG tablet, Take 1 tablet (100 mg total) by mouth 2 (two) times daily., Disp: 20 tablet, Rfl: 0   predniSONE (DELTASONE) 20 MG tablet, Take 2 tablets (40 mg total) by mouth daily with breakfast., Disp: 10 tablet, Rfl: 0   promethazine-dextromethorphan (PROMETHAZINE-DM) 6.25-15 MG/5ML syrup, Take 5 mLs by mouth 4 (four) times daily as needed., Disp: 118 mL, Rfl: 0   gabapentin (NEURONTIN) 100 MG capsule, Take 2 capsules (200 mg total) by mouth 3 (three) times daily., Disp: 180 capsule, Rfl: 0   ibuprofen (ADVIL) 200 MG tablet, Take 600 mg by mouth every 6 (six) hours as needed for fever, headache or mild pain., Disp: , Rfl:    levothyroxine (SYNTHROID) 25 MCG tablet, TAKE 1 TABLET(25 MCG) BY MOUTH DAILY, Disp: 90 tablet, Rfl: 0   nicotine (NICODERM CQ - DOSED IN MG/24 HOURS) 14 mg/24hr patch, Place 1 patch (14 mg total) onto the skin daily., Disp: 14 patch, Rfl: 0   QUEtiapine (SEROQUEL) 50 MG tablet, Take 1 tablet (50 mg total) by mouth at bedtime., Disp: 30 tablet, Rfl: 0   sertraline (ZOLOFT) 50 MG tablet, Take 1 tablet (50 mg total) by mouth  daily., Disp: 30 tablet, Rfl: 3   Medications ordered in this encounter:  Meds ordered this encounter  Medications   DISCONTD: azithromycin (ZITHROMAX) 250 MG tablet    Sig: Take 2 tablets on day 1, then 1 tablet daily on days 2 through 5    Dispense:  6 tablet    Refill:  0    Order Specific Question:   Supervising Provider    Answer:   Merrilee Jansky [[id number]]   predniSONE (DELTASONE) 20 MG tablet    Sig: Take 2 tablets (40 mg total) by mouth daily with breakfast.    Dispense:  10 tablet    Refill:  0    Order Specific Question:   Supervising Provider    Answer:   Merrilee Jansky [[id number]]   albuterol (VENTOLIN HFA) 108 (90 Base) MCG/ACT inhaler    Sig: Inhale 1-2 puffs into the lungs every 6 (six) hours as needed.    Dispense:  8 g    Refill:  0    Order Specific Question:   Supervising Provider    Answer:   Merrilee Jansky [id number]   promethazine-dextromethorphan (PROMETHAZINE-DM) 6.25-15 MG/5ML syrup    Sig: Take 5 mLs by mouth 4 (four) times daily as needed.    Dispense:  118 mL    Refill:  0    Order Specific Question:   Supervising Provider    Answer:   Merrilee Jansky [id number]   doxycycline (VIBRA-TABS) 100  MG tablet    Sig: Take 1 tablet (100 mg total) by mouth 2 (two) times daily.    Dispense:  20 tablet    Refill:  0    To replace zpack due to interaction with Seroquel    Order Specific Question:   Supervising Provider    Answer:   Merrilee Jansky [[id number]]     *If you need refills on other medications prior to your next appointment, please contact your pharmacy*  Follow-Up: Call back or seek an in-person evaluation if the symptoms worsen or if the condition fails to improve as anticipated.  [facility] [phone]  Other Instructions Acute Bronchitis, Adult  Acute bronchitis is sudden inflammation of the main airways (bronchi) that come off the windpipe (trachea) in the lungs. The swelling causes the airways to get smaller and  make more mucus than normal. This can make it hard to breathe and can cause coughing or noisy breathing (wheezing). Acute bronchitis may last several weeks. The cough may last longer. Allergies, asthma, and exposure to smoke may make the condition worse. What are the causes? This condition can be caused by germs and by substances that irritate the lungs, including: Cold and flu viruses. The most common cause of this condition is the virus that causes the common cold. Bacteria. This is less common. Breathing in substances that irritate the lungs, including: Smoke from cigarettes and other forms of tobacco. Dust and pollen. Fumes from household cleaning products, gases, or burned fuel. Indoor or outdoor air pollution. What increases the risk? The following factors may make you more likely to develop this condition: A weak body's defense system, also called the immune system. A condition that affects your lungs and breathing, such as asthma. What are the signs or symptoms? Common symptoms of this condition include: Coughing. This may bring up clear, yellow, or green mucus from your lungs (sputum). Wheezing. Runny or stuffy nose. Having too much mucus in your lungs (chest congestion). Shortness of breath. Aches and pains, including sore throat or chest. How is this diagnosed? This condition is usually diagnosed based on: Your symptoms and medical history. A physical exam. You may also have other tests, including tests to rule out other conditions, such as pneumonia. These tests include: A test of lung function. Test of a mucus sample to look for the presence of bacteria. Tests to check the oxygen level in your blood. Blood tests. Chest X-ray. How is this treated? Most cases of acute bronchitis clear up over time without treatment. Your health care provider may recommend: Drinking more fluids to help thin your mucus so it is easier to cough up. Taking inhaled medicine (inhaler) to  improve air flow in and out of your lungs. Using a vaporizer or a humidifier. These are machines that add water to the air to help you breathe better. Taking a medicine that thins mucus and clears congestion (expectorant). Taking a medicine that prevents or stops coughing (cough suppressant). It is not common to take an antibiotic medicine for this condition. Follow these instructions at home:  Take over-the-counter and prescription medicines only as told by your health care provider. Use an inhaler, vaporizer, or humidifier as told by your health care provider. Take two teaspoons (10 mL) of honey at bedtime to lessen coughing at night. Drink enough fluid to keep your urine pale yellow. Do not use any products that contain nicotine or tobacco. These products include cigarettes, chewing tobacco, and vaping devices, such as  e-cigarettes. If you need help quitting, ask your health care provider. Get plenty of rest. Return to your normal activities as told by your health care provider. Ask your health care provider what activities are safe for you. Keep all follow-up visits. This is important. How is this prevented? To lower your risk of getting this condition again: Wash your hands often with soap and water for at least 20 seconds. If soap and water are not available, use hand sanitizer. Avoid contact with people who have cold symptoms. Try not to touch your mouth, nose, or eyes with your hands. Avoid breathing in smoke or chemical fumes. Breathing smoke or chemical fumes will make your condition worse. Get the flu shot every year. Contact a health care provider if: Your symptoms do not improve after 2 weeks. You have trouble coughing up the mucus. Your cough keeps you awake at night. You have a fever. Get help right away if you: Cough up blood. Feel pain in your chest. Have severe shortness of breath. Faint or keep feeling like you are going to faint. Have a severe headache. Have a  fever or chills that get worse. These symptoms may represent a serious problem that is an emergency. Do not wait to see if the symptoms will go away. Get medical help right away. Call your local emergency services (911 in the [place] Do not drive yourself to the hospital. Summary Acute bronchitis is inflammation of the main airways (bronchi) that come off the windpipe (trachea) in the lungs. The swelling causes the airways to get smaller and make more mucus than normal. Drinking more fluids can help thin your mucus so it is easier to cough up. Take over-the-counter and prescription medicines only as told by your health care provider. Do not use any products that contain nicotine or tobacco. These products include cigarettes, chewing tobacco, and vaping devices, such as e-cigarettes. If you need help quitting, ask your health care provider. Contact a health care provider if your symptoms do not improve after 2 weeks. This information is not intended to replace advice given to you by your health care provider. Make sure you discuss any questions you have with your health care provider. Document Revised: 05/28/2021 Document Reviewed: 06/18/2020 Elsevier Patient Education  2024 Elsevier Inc.    If you have been instructed to have an in-person evaluation today at a local Urgent Care facility, please use the link below. It will take you to a list of all of our available [facility], including address, phone number and hours of operation. Please do not delay care.  [facility]  If you or a family member do not have a primary care provider, use the link below to schedule a visit and establish care. When you choose a [facility] primary care physician or advanced practice provider, you gain a long-term partner in health. Find a Primary Care Provider  Learn more about [facility]'s in-office and virtual care options:  - Get Care Now

## 2023-01-14 NOTE — Progress Notes (Signed)
Virtual Visit Consent   Abigail Rosario, you are scheduled for a virtual visit with a [facility] provider today. Just as with appointments in the office, your consent must be obtained to participate. Your consent will be active for this visit and any virtual visit you may have with one of our providers in the next 365 days. If you have a MyChart account, a copy of this consent can be sent to you electronically.  As this is a virtual visit, video technology does not allow for your provider to perform a traditional examination. This may limit your provider's ability to fully assess your condition. If your provider identifies any concerns that need to be evaluated in person or the need to arrange testing (such as labs, EKG, etc.), we will make arrangements to do so. Although advances in technology are sophisticated, we cannot ensure that it will always work on either your end or our end. If the connection with a video visit is poor, the visit may have to be switched to a telephone visit. With either a video or telephone visit, we are not always able to ensure that we have a secure connection.  By engaging in this virtual visit, you consent to the provision of healthcare and authorize for your insurance to be billed (if applicable) for the services provided during this visit. Depending on your insurance coverage, you may receive a charge related to this service.  I need to obtain your verbal consent now. Are you willing to proceed with your visit today? Abigail Rosario has provided verbal consent on 01/14/2023 for a virtual visit (video or telephone). [name], PA-C  Date: 01/14/2023 11:57 AM  Virtual Visit via Video Note   I, [name], connected with  Abigail Rosario  ([id number], 1980/10/26) on 01/14/23 at 12:00 PM EST by a video-enabled telemedicine application and verified that I am speaking with the correct person using two identifiers.  Location: Patient: Virtual Visit  Location Patient: Home Provider: Virtual Visit Location Provider: Home Office   I discussed the limitations of evaluation and management by telemedicine and the availability of in person appointments. The patient expressed understanding and agreed to proceed.    History of Present Illness: Abigail Rosario is a 42 y.o. who identifies as a female who was assigned female at birth, and is being seen today for cough and congestion.  HPI: Cough This is a new problem. The current episode started 1 to 4 weeks ago (2 weeks). The problem has been gradually worsening. The problem occurs every few minutes. The cough is Productive of sputum and productive of purulent sputum. Associated symptoms include chest pain (tightness), chills, a fever, headaches, myalgias, nasal congestion, postnasal drip, rhinorrhea, a sore throat (initially and now improved), shortness of breath (with coughing spell), sweats and wheezing. Pertinent negatives include no ear congestion or ear pain. The symptoms are aggravated by lying down. Treatments tried: vicks vapor spray, cough drops, dayquil, nyquil. The treatment provided no relief. There is no history of asthma, bronchitis, COPD, environmental allergies or pneumonia.     Problems:  Patient Active Problem List   Diagnosis Date Noted   Folliculitis of both axillae 11/10/2022   Routine screening for STI (sexually transmitted infection) 11/10/2022   Furuncle of left axilla 11/10/2022   Class 1 obesity due to excess calories without serious comorbidity with body mass index (BMI) of 30.0 to 30.9 in adult 09/29/2022   Bipolar 1 disorder, manic, mild (HCC) 05/13/2022   Anxiety and fearfulness of  childhood and adolescence 05/13/2022   Depression 05/13/2022   OCD (obsessive compulsive disorder) 05/13/2022   PTSD (post-traumatic stress disorder) 05/13/2022   Overweight (BMI 25.0-29.9) 05/13/2022   Physical exam, annual 03/23/2022   Nabothian cyst 03/23/2022   Ruptured globe, left  eye 01/18/2020   Substance use disorder 01/10/2020   MDD (major depressive disorder) 01/09/2020   Bacteremia 02/10/2017   Abnormal urinalysis 02/07/2017   Calculus of gallbladder with acute cholecystitis without obstruction 02/07/2017   Cholecystitis, acute 02/07/2017   Elevated LDH 02/07/2017   Elevated bilirubin 02/07/2017   Elevated liver enzymes 02/07/2017   Epigastric pain 02/07/2017   Jaundice 02/07/2017   Anxiety and depression 02/07/2017    Allergies:  Allergies  Allergen Reactions   Septra [Sulfamethoxazole-Trimethoprim] Hives and Rash   Medications:  Current Outpatient Medications:    gabapentin (NEURONTIN) 100 MG capsule, Take 2 capsules (200 mg total) by mouth 3 (three) times daily., Disp: 180 capsule, Rfl: 0   ibuprofen (ADVIL) 200 MG tablet, Take 600 mg by mouth every 6 (six) hours as needed for fever, headache or mild pain., Disp: , Rfl:    levothyroxine (SYNTHROID) 25 MCG tablet, TAKE 1 TABLET(25 MCG) BY MOUTH DAILY, Disp: 90 tablet, Rfl: 0   nicotine (NICODERM CQ - DOSED IN MG/24 HOURS) 14 mg/24hr patch, Place 1 patch (14 mg total) onto the skin daily., Disp: 14 patch, Rfl: 0   QUEtiapine (SEROQUEL) 50 MG tablet, Take 1 tablet (50 mg total) by mouth at bedtime., Disp: 30 tablet, Rfl: 0   sertraline (ZOLOFT) 50 MG tablet, Take 1 tablet (50 mg total) by mouth daily., Disp: 30 tablet, Rfl: 3  Observations/Objective: Patient is well-developed, well-nourished in no acute distress.  Resting comfortably at home.  Head is normocephalic, atraumatic.  No labored breathing.  Speech is clear and coherent with logical content.  Patient is alert and oriented at baseline.  Deep bronchial cough heard twice with an audible wheeze following coughing spell not affecting speech  Assessment and Plan: There are no diagnoses linked to this encounter.  - Worsening over a week despite OTC medications - Will treat with Doxycycline, Albuterol, Prednisone and Promethazine DM - Can  continue Mucinex  - Push fluids.  - Rest.  - Steam and humidifier can help - Seek in person evaluation if worsening or symptoms fail to improve    Follow Up Instructions: I discussed the assessment and treatment plan with the patient. The patient was provided an opportunity to ask questions and all were answered. The patient agreed with the plan and demonstrated an understanding of the instructions.  A copy of instructions were sent to the patient via MyChart unless otherwise noted below.    The patient was advised to call back or seek an in-person evaluation if the symptoms worsen or if the condition fails to improve as anticipated.    [name], PA-C

## 2023-01-17 IMAGING — DX DG HAND COMPLETE 3+V*R*
3 series · 3 of 3 positions shown · non-contrast
Comparison: None.

CLINICAL DATA: Hand injury, pain most severe at right first
metacarpal and phalanges

EXAM:
RIGHT HAND - COMPLETE 3+ VIEW

[hand pa]
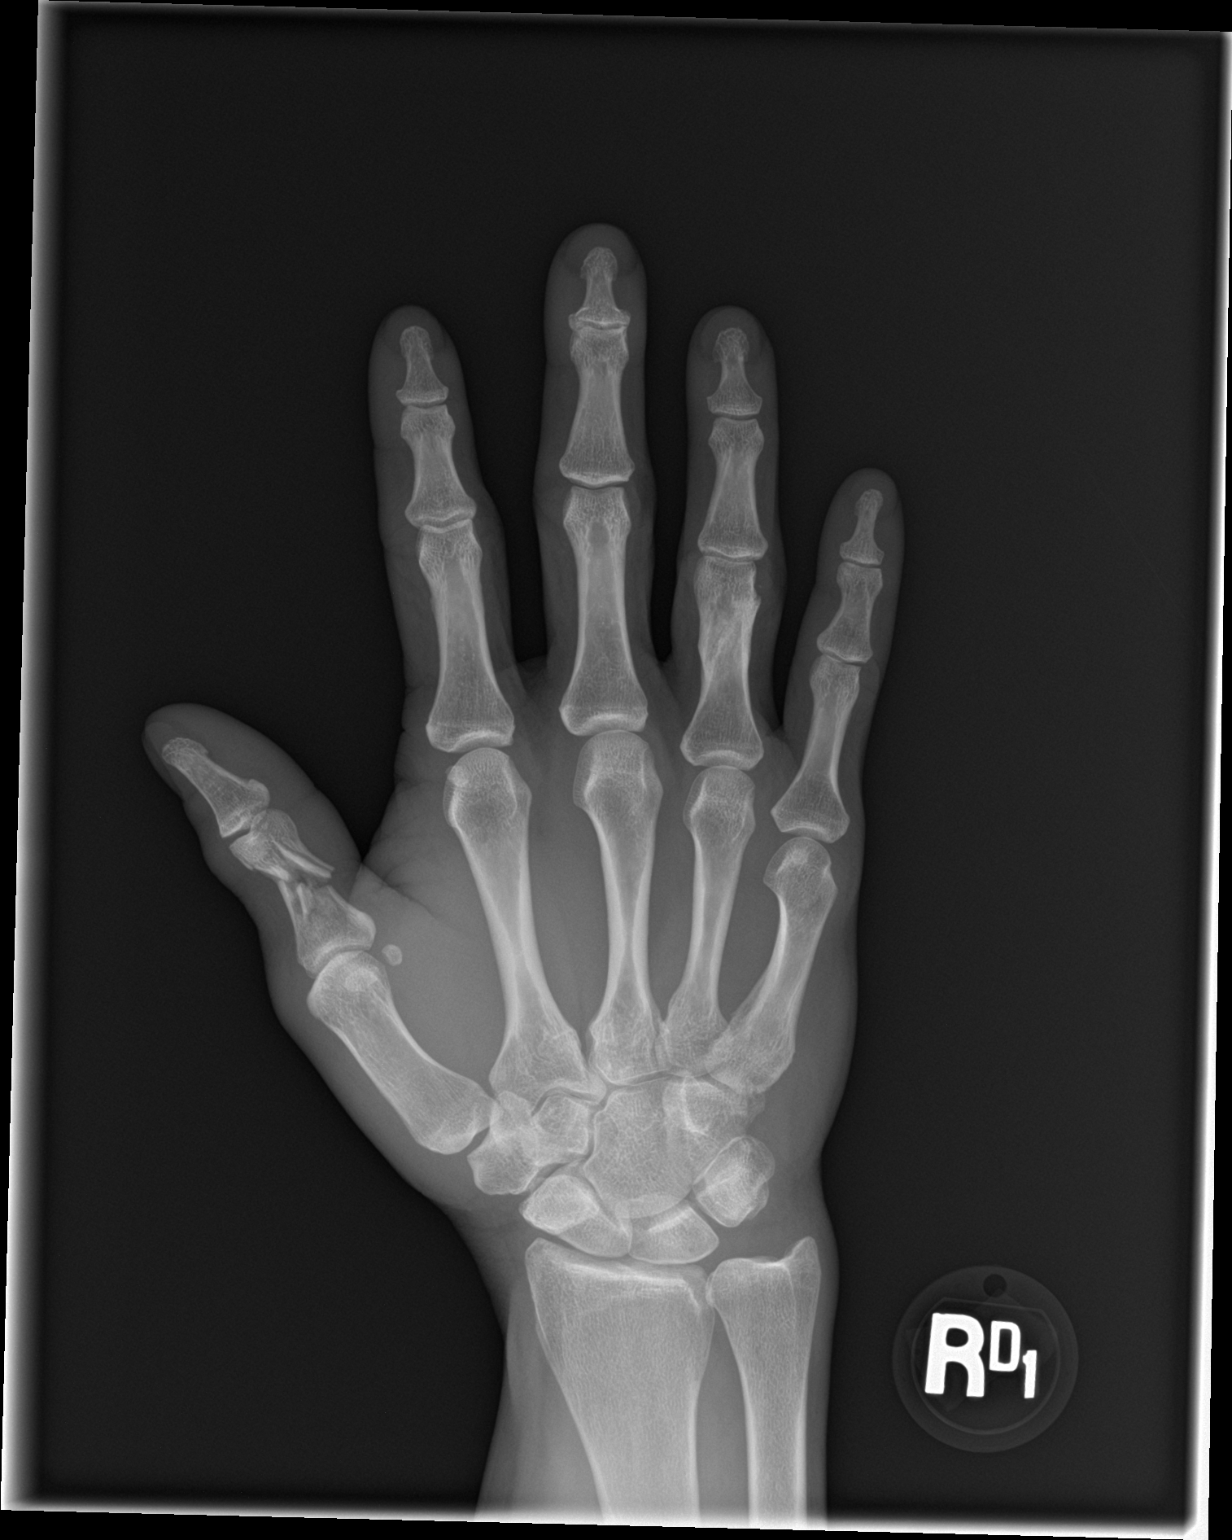

[hand lat]
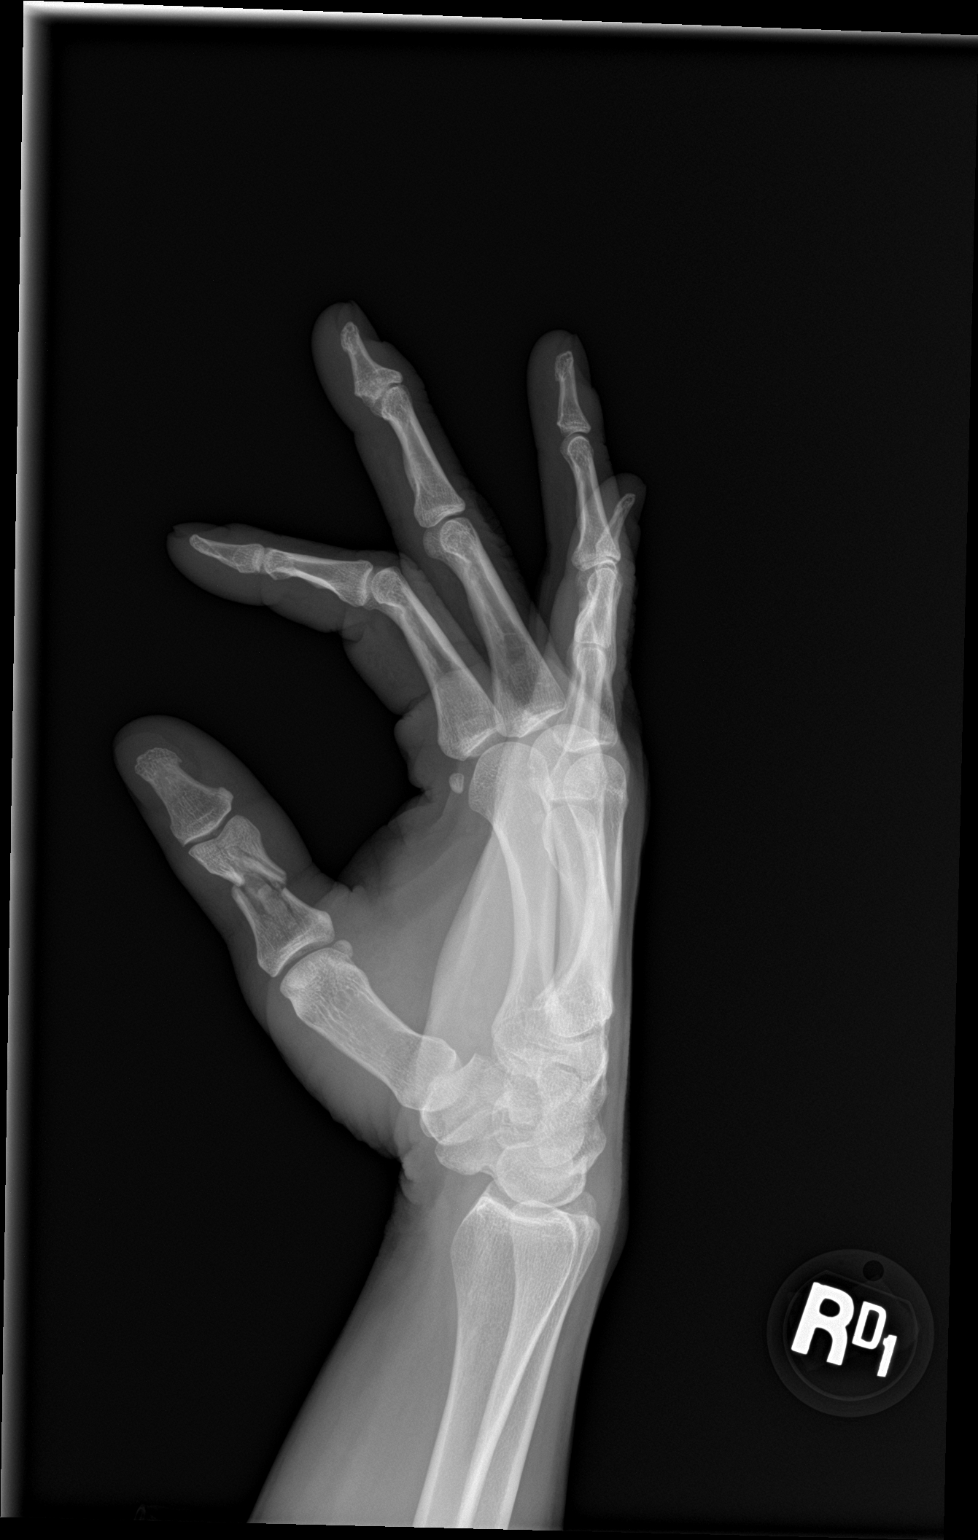

[hand obl]
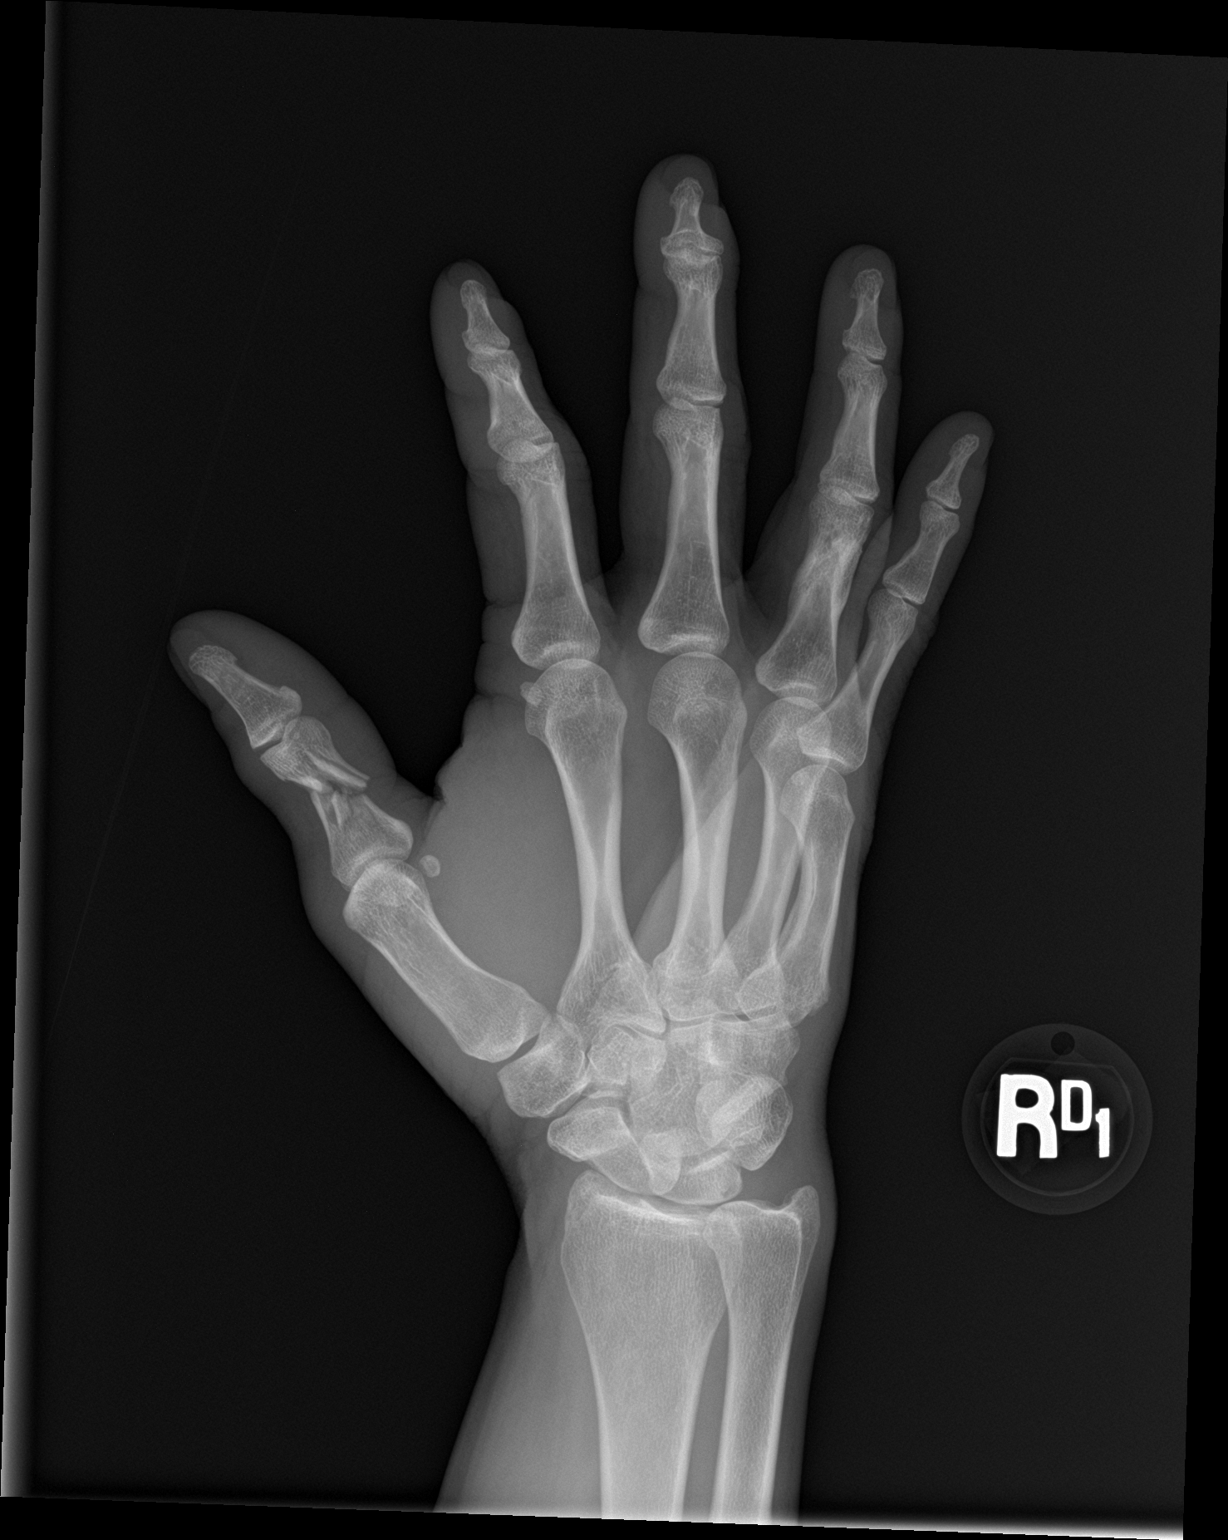

[3 of 3 positions shown; findings below may reference images not displayed]

FINDINGS: There is a comminuted and mildly displaced fracture of the diaphysis
of the thumb proximal phalanx. There is no definite intra-articular
extension. The MCP joint and interphalangeal joint alignment is
maintained. There is surrounding soft tissue swelling.

There is deformity of the ring finger proximal phalanx likely
reflecting sequela of prior fracture. There is no other evidence of
acute fracture. Bony alignment is normal. The joint spaces are
maintained.
IMPRESSION: Comminuted and mildly displaced fracture of the diaphysis of the
thumb proximal phalanx without definite evidence of intra-articular
extension.

## 2023-02-24 ENCOUNTER — Telehealth: Payer: No Typology Code available for payment source | Admitting: Nurse Practitioner

## 2023-02-24 DIAGNOSIS — J22 Unspecified acute lower respiratory infection: Secondary | ICD-10-CM | POA: Diagnosis not present

## 2023-02-24 MED ORDER — AMOXICILLIN 875 MG PO TABS
875.0000 mg | ORAL_TABLET | Freq: Two times a day (BID) | ORAL | 0 refills | Status: AC
Start: 2023-02-24 — End: 2023-03-06

## 2023-02-24 MED ORDER — PROMETHAZINE-DM 6.25-15 MG/5ML PO SYRP: 5.0000 mL | ORAL_SOLUTION | Freq: Four times a day (QID) | ORAL | 0 refills | Status: DC | PRN

## 2023-02-24 MED ORDER — PREDNISONE 20 MG PO TABS
20.0000 mg | ORAL_TABLET | Freq: Two times a day (BID) | ORAL | 0 refills | Status: AC
Start: 2023-02-24 — End: 2023-03-01

## 2023-02-24 NOTE — Progress Notes (Signed)
Virtual Visit Consent   Abigail Rosario, you are scheduled for a virtual visit with a [employer] provider today. Just as with appointments in the office, your consent must be obtained to participate. Your consent will be active for this visit and any virtual visit you may have with one of our providers in the next 365 days. If you have a MyChart account, a copy of this consent can be sent to you electronically.  As this is a virtual visit, video technology does not allow for your provider to perform a traditional examination. This may limit your provider's ability to fully assess your condition. If your provider identifies any concerns that need to be evaluated in person or the need to arrange testing (such as labs, EKG, etc.), we will make arrangements to do so. Although advances in technology are sophisticated, we cannot ensure that it will always work on either your end or our end. If the connection with a video visit is poor, the visit may have to be switched to a telephone visit. With either a video or telephone visit, we are not always able to ensure that we have a secure connection.  By engaging in this virtual visit, you consent to the provision of healthcare and authorize for your insurance to be billed (if applicable) for the services provided during this visit. Depending on your insurance coverage, you may receive a charge related to this service.  I need to obtain your verbal consent now. Are you willing to proceed with your visit today? Camera Haker has provided verbal consent on 02/24/2023 for a virtual visit (video or telephone). Viviano Simas, FNP  Date: 02/24/2023 6:20 PM  Virtual Visit via Video Note   I, Viviano Simas, connected with  Abigail Rosario  ([id number], October 11, 1980) on 02/24/23 at  6:30 PM EST by a video-enabled telemedicine application and verified that I am speaking with the correct person using two identifiers.  Location: Patient: Virtual Visit Location Patient:  Home Provider: Virtual Visit Location Provider: Home Office   I discussed the limitations of evaluation and management by telemedicine and the availability of in person appointments. The patient expressed understanding and agreed to proceed.    History of Present Illness: Abigail Rosario is a 42 y.o. who identifies as a female who was assigned female at birth, and is being seen today for a cough.   Her cough has been constant with sinus congestion  She has used an entire bottle of cough syrup   She was treated for bacterial bronchitis 01/14/23 and she states that she did recover from that   She does not have asthma but she does have an inhaler she has needed in the past   Has not had a fever but feels today that she may be developing one   Her cough is only productive in the morning and is dry throughout the day   Her nose is running but it is clear     Problems:  Patient Active Problem List   Diagnosis Date Noted   Folliculitis of both axillae 11/10/2022   Routine screening for STI (sexually transmitted infection) 11/10/2022   Furuncle of left axilla 11/10/2022   Class 1 obesity due to excess calories without serious comorbidity with body mass index (BMI) of 30.0 to 30.9 in adult 09/29/2022   Bipolar 1 disorder, manic, mild (HCC) 05/13/2022   Anxiety and fearfulness of childhood and adolescence 05/13/2022   Depression 05/13/2022   OCD (obsessive compulsive disorder) 05/13/2022   PTSD (post-traumatic  stress disorder) 05/13/2022   Overweight (BMI 25.0-29.9) 05/13/2022   Physical exam, annual 03/23/2022   Nabothian cyst 03/23/2022   Ruptured globe, left eye 01/18/2020   Substance use disorder 01/10/2020   MDD (major depressive disorder) 01/09/2020   Bacteremia 02/10/2017   Abnormal urinalysis 02/07/2017   Calculus of gallbladder with acute cholecystitis without obstruction 02/07/2017   Cholecystitis, acute 02/07/2017   Elevated LDH 02/07/2017   Elevated bilirubin  02/07/2017   Elevated liver enzymes 02/07/2017   Epigastric pain 02/07/2017   Jaundice 02/07/2017   Anxiety and depression 02/07/2017    Allergies:  Allergies  Allergen Reactions   Septra [Sulfamethoxazole-Trimethoprim] Hives and Rash   Medications:  Current Outpatient Medications:    albuterol (VENTOLIN HFA) 108 (90 Base) MCG/ACT inhaler, Inhale 1-2 puffs into the lungs every 6 (six) hours as needed., Disp: 8 g, Rfl: 0   doxycycline (VIBRA-TABS) 100 MG tablet, Take 1 tablet (100 mg total) by mouth 2 (two) times daily., Disp: 20 tablet, Rfl: 0   gabapentin (NEURONTIN) 100 MG capsule, Take 2 capsules (200 mg total) by mouth 3 (three) times daily., Disp: 180 capsule, Rfl: 0   ibuprofen (ADVIL) 200 MG tablet, Take 600 mg by mouth every 6 (six) hours as needed for fever, headache or mild pain., Disp: , Rfl:    levothyroxine (SYNTHROID) 25 MCG tablet, TAKE 1 TABLET(25 MCG) BY MOUTH DAILY, Disp: 90 tablet, Rfl: 0   nicotine (NICODERM CQ - DOSED IN MG/24 HOURS) 14 mg/24hr patch, Place 1 patch (14 mg total) onto the skin daily., Disp: 14 patch, Rfl: 0   predniSONE (DELTASONE) 20 MG tablet, Take 2 tablets (40 mg total) by mouth daily with breakfast., Disp: 10 tablet, Rfl: 0   promethazine-dextromethorphan (PROMETHAZINE-DM) 6.25-15 MG/5ML syrup, Take 5 mLs by mouth 4 (four) times daily as needed., Disp: 118 mL, Rfl: 0   QUEtiapine (SEROQUEL) 50 MG tablet, Take 1 tablet (50 mg total) by mouth at bedtime., Disp: 30 tablet, Rfl: 0   sertraline (ZOLOFT) 50 MG tablet, Take 1 tablet (50 mg total) by mouth daily., Disp: 30 tablet, Rfl: 3  Observations/Objective: Patient is well-developed, well-nourished in no acute distress.  Resting comfortably  at home.  Head is normocephalic, atraumatic.  No labored breathing.  Speech is clear and coherent with logical content.  Patient is alert and oriented at baseline.    Assessment and Plan:  1. Lower respiratory tract infection (Primary)  -  promethazine-dextromethorphan (PROMETHAZINE-DM) 6.25-15 MG/5ML syrup; Take 5 mLs by mouth 4 (four) times daily as needed for cough.  Dispense: 118 mL; Refill: 0 - predniSONE (DELTASONE) 20 MG tablet; Take 1 tablet (20 mg total) by mouth 2 (two) times daily with a meal for 5 days.  Dispense: 10 tablet; Refill: 0 - amoxicillin (AMOXIL) 875 MG tablet; Take 1 tablet (875 mg total) by mouth 2 (two) times daily for 10 days.  Dispense: 20 tablet; Refill: 0     Follow Up Instructions: I discussed the assessment and treatment plan with the patient. The patient was provided an opportunity to ask questions and all were answered. The patient agreed with the plan and demonstrated an understanding of the instructions.  A copy of instructions were sent to the patient via MyChart unless otherwise noted below.    The patient was advised to call back or seek an in-person evaluation if the symptoms worsen or if the condition fails to improve as anticipated.    Viviano Simas, FNP

## 2023-02-26 ENCOUNTER — Telehealth: Payer: MEDICAID | Admitting: Nurse Practitioner

## 2023-03-18 ENCOUNTER — Ambulatory Visit (INDEPENDENT_AMBULATORY_CARE_PROVIDER_SITE_OTHER): Payer: MEDICAID | Admitting: Family Medicine

## 2023-03-18 ENCOUNTER — Encounter: Payer: Self-pay | Admitting: Family Medicine

## 2023-03-18 VITALS — BP 120/80 | HR 91 | Temp 98.2°F | Ht 65.5 in | Wt 206.4 lb

## 2023-03-18 DIAGNOSIS — M26621 Arthralgia of right temporomandibular joint: Secondary | ICD-10-CM

## 2023-03-18 DIAGNOSIS — J209 Acute bronchitis, unspecified: Secondary | ICD-10-CM | POA: Diagnosis not present

## 2023-03-18 DIAGNOSIS — Z6833 Body mass index (BMI) 33.0-33.9, adult: Secondary | ICD-10-CM | POA: Insufficient documentation

## 2023-03-18 DIAGNOSIS — M26629 Arthralgia of temporomandibular joint, unspecified side: Secondary | ICD-10-CM | POA: Insufficient documentation

## 2023-03-18 MED ORDER — PREDNISONE 10 MG (21) PO TBPK: ORAL_TABLET | ORAL | 0 refills | Status: DC

## 2023-03-18 MED ORDER — BENZONATATE 100 MG PO CAPS: 100.0000 mg | ORAL_CAPSULE | Freq: Three times a day (TID) | ORAL | 0 refills | Status: DC | PRN

## 2023-03-18 MED ORDER — CYCLOBENZAPRINE HCL 5 MG PO TABS: 5.0000 mg | ORAL_TABLET | Freq: Three times a day (TID) | ORAL | 0 refills | Status: DC | PRN

## 2023-03-18 NOTE — Progress Notes (Signed)
Patient Office Visit  Assessment & Plan:  Acute bronchitis, unspecified organism -     Benzonatate; Take 1 capsule (100 mg total) by mouth 3 (three) times daily as needed for cough.  Dispense: 20 capsule; Refill: 0 -     predniSONE; Use as directed.  Dispense: 21 each; Refill: 0  Arthralgia of right temporomandibular joint -     Cyclobenzaprine HCl; Take 1 tablet (5 mg total) by mouth 3 (three) times daily as needed for muscle spasms. At night time for TMJ pain (can cause sedation).  Dispense: 30 tablet; Refill: 0  BMI 33.0-33.9,adult    No follow-ups on file.  Reviewed previous urgent care notes.  Tessalon Perles 100 mg 3 times daily.  Cough.  Flexeril 5 mg 3 times daily as needed spasm for right TMJ.  Prednisone taper.  Recommend tobacco cessation.  Recommend she schedule follow-up visit regarding weight issues and chronic medical issues with her primary care provider.  Subjective:    Patient ID: Abigail Rosario, female    DOB: [birth year]  Age: 43 y.o. MRN: [id number]  Chief Complaint  Patient presents with   Neck Pain    X 2 weeks. Right sided. Swollen around thyroid area.    Cough    HPI Acute Bronchitis: Patient has been coughing on and off for the last couple of months.  Patient had 2 urgent care visits in the last 1 being a virtual visit.  Pt has been on cough med, antibiotics, and prednisone, finished meds about 2 weeks ago. Pt is doing slightly better with cough overall but still having productive cough. Still smoking but trying to quit-Pt using nicoderm patches but smoking 1-2 cigs per day. Pt not having fever but has subjective chills. Pt has been wheezing on and off and having sore throat. No ill contacts per patient. Pt has an inhaler at home and using it some. Pt not using OTC meds right now.  Right TMJ pain-patient does grind and clench her teeth a lot.  Patient not sure why.  Sometimes pain radiates to right ear and neck area. Patient does not wear a mouthguard.   Patient has not tried any medications for this. Pt has been on prednisone 2 times for bronchitis in the last month or so.  Weight issues-patient has gained weight in the last couple years and is frustrated with this.  Patient would like some assistance with weight loss.  Patient will set up an appointment to discuss this with her primary care provider. The 10-year ASCVD risk score (Arnett DK, et al., 2019) is: 1.2%  Patient Active Problem List   Diagnosis Date Noted   Acute bronchitis 03/18/2023   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/18/2023   BMI 33.0-33.9,adult 03/18/2023   Folliculitis of both axillae 11/10/2022   Routine screening for STI (sexually transmitted infection) 11/10/2022   Furuncle of left axilla 11/10/2022   Class 1 obesity due to excess calories without serious comorbidity with body mass index (BMI) of 30.0 to 30.9 in adult 09/29/2022   Bipolar 1 disorder, manic, mild (HCC) 05/13/2022   Anxiety and fearfulness of childhood and adolescence 05/13/2022   Depression 05/13/2022   OCD (obsessive compulsive disorder) 05/13/2022   PTSD (post-traumatic stress disorder) 05/13/2022   Overweight (BMI 25.0-29.9) 05/13/2022   Physical exam, annual 03/23/2022   Nabothian cyst 03/23/2022   Ruptured globe, left eye 01/18/2020   Substance use disorder 01/10/2020   MDD (major depressive disorder) 01/09/2020   Bacteremia 02/10/2017   Abnormal urinalysis 02/07/2017  Calculus of gallbladder with acute cholecystitis without obstruction 02/07/2017   Cholecystitis, acute 02/07/2017   Elevated LDH 02/07/2017   Elevated bilirubin 02/07/2017   Elevated liver enzymes 02/07/2017   Epigastric pain 02/07/2017   Jaundice 02/07/2017   Anxiety and depression 02/07/2017   Past Medical History:  Diagnosis Date   Drug abuse (HCC)    Hepatitis C    Past Surgical History:  Procedure Laterality Date   RUPTURED GLOBE EXPLORATION AND REPAIR Left 01/18/2020   Procedure: REPAIR OF RUPTURED GLOBE;  Surgeon: Marcelline Deist, MD;  Location: [facility] OR;  Service: Ophthalmology;  Laterality: Left;   Social History   Tobacco Use   Smoking status: Every Day    Current packs/day: 1.00    Average packs/day: 1 pack/day for 10.0 years (10.0 ttl pk-yrs)    Types: Cigarettes   Smokeless tobacco: Never  Vaping Use   Vaping status: Never Used  Substance Use Topics   Alcohol use: Yes   Drug use: Yes    Types: Methamphetamines, Marijuana   History reviewed. No pertinent family history. Allergies  Allergen Reactions   Septra [Sulfamethoxazole-Trimethoprim] Hives and Rash      ROS    Objective:    BP 120/80 (BP Location: Left Arm)   Pulse 91   Temp 98.2 F (36.8 C)   Wt 206 lb 6 oz (93.6 kg)   SpO2 99%   BMI 33.82 kg/m  BP Readings from Last 3 Encounters:  03/18/23 120/80  11/22/22 (!) 106/59  11/10/22 118/76   Wt Readings from Last 3 Encounters:  03/18/23 206 lb 6 oz (93.6 kg)  11/22/22 194 lb (88 kg)  11/10/22 196 lb (88.9 kg)    Physical Exam Vitals and nursing note reviewed.  Constitutional:      Appearance: Normal appearance.  HENT:     Head: Normocephalic.     Jaw: Tenderness and pain on movement present.     Right Ear: Tympanic membrane, ear canal and external ear normal.     Left Ear: Tympanic membrane, ear canal and external ear normal.  Eyes:     Extraocular Movements: Extraocular movements intact.     Conjunctiva/sclera: Conjunctivae normal.     Pupils: Pupils are equal, round, and reactive to light.  Cardiovascular:     Rate and Rhythm: Normal rate and regular rhythm.     Heart sounds: Normal heart sounds.  Pulmonary:     Effort: Pulmonary effort is normal.     Breath sounds: Normal breath sounds.  Musculoskeletal:     Right lower leg: No edema.     Left lower leg: No edema.     Comments: Has crepitus over right TMJ  Neurological:     General: No focal deficit present.     Mental Status: She is alert and oriented to person, place, and time.  Psychiatric:         Mood and Affect: Mood normal.        Behavior: Behavior normal.        Thought Content: Thought content normal.        Judgment: Judgment normal.      No results found for any visits on 03/18/23.

## 2023-03-28 ENCOUNTER — Encounter: Payer: Self-pay | Admitting: Family Medicine

## 2023-03-29 ENCOUNTER — Other Ambulatory Visit: Payer: Self-pay | Admitting: Family Medicine

## 2023-04-04 ENCOUNTER — Encounter: Payer: Self-pay | Admitting: Family Medicine

## 2023-04-04 ENCOUNTER — Ambulatory Visit (INDEPENDENT_AMBULATORY_CARE_PROVIDER_SITE_OTHER): Payer: No Typology Code available for payment source | Admitting: Family Medicine

## 2023-04-04 VITALS — BP 122/82 | HR 76 | Temp 98.3°F | Ht 65.0 in | Wt 202.0 lb

## 2023-04-04 DIAGNOSIS — R7989 Other specified abnormal findings of blood chemistry: Secondary | ICD-10-CM | POA: Diagnosis not present

## 2023-04-04 DIAGNOSIS — E66811 Obesity, class 1: Secondary | ICD-10-CM

## 2023-04-04 DIAGNOSIS — Z6833 Body mass index (BMI) 33.0-33.9, adult: Secondary | ICD-10-CM

## 2023-04-04 DIAGNOSIS — Z23 Encounter for immunization: Secondary | ICD-10-CM

## 2023-04-04 DIAGNOSIS — E6609 Other obesity due to excess calories: Secondary | ICD-10-CM | POA: Insufficient documentation

## 2023-04-04 NOTE — Assessment & Plan Note (Signed)
Counseled on importance of weight management for overall health. Encouraged to speak with her psychiatrist about medication management and impulse control. Encouraged low calorie, heart healthy diet and moderate intensity exercise 150 minutes weekly. This is 3-5 times weekly for 30-50 minutes each session. Goal should be pace of 3 miles/hours, or walking 1.5 miles in 30 minutes and include strength training. Discussed risks of obesity. Declines MWM.

## 2023-04-04 NOTE — Progress Notes (Signed)
Subjective:  HPI: Abigail Rosario is a 43 y.o. female presenting on 04/04/2023 for Follow-up (Discuss wt)   HPI Patient is in today for weight management. She would like to change her Seroquel to Xanax as she feels she is gaining weight. This is managed by [facility] and she has not spoken to them regarding this concern. She is also unsure what medications she is taking and dosing. She reports she is eating late at night and consuming large amounts of unhealthy calories. She does not have a current exercise regimen other than occasional walking.   Review of Systems  Psychiatric/Behavioral:  Positive for depression. The patient is nervous/anxious.   All other systems reviewed and are negative.   Relevant past medical history reviewed and updated as indicated.   Past Medical History:  Diagnosis Date   Drug abuse (HCC)    Hepatitis C      Past Surgical History:  Procedure Laterality Date   RUPTURED GLOBE EXPLORATION AND REPAIR Left 01/18/2020   Procedure: REPAIR OF RUPTURED GLOBE;  Surgeon: Marcelline Deist, MD;  Location: [facility] OR;  Service: Ophthalmology;  Laterality: Left;    Allergies and medications reviewed and updated.   Current Outpatient Medications:    albuterol (VENTOLIN HFA) 108 (90 Base) MCG/ACT inhaler, Inhale 1-2 puffs into the lungs every 6 (six) hours as needed., Disp: 8 g, Rfl: 0   cyclobenzaprine (FLEXERIL) 5 MG tablet, Take 1 tablet (5 mg total) by mouth 3 (three) times daily as needed for muscle spasms. At night time for TMJ pain (can cause sedation)., Disp: 30 tablet, Rfl: 0   gabapentin (NEURONTIN) 100 MG capsule, Take 2 capsules (200 mg total) by mouth 3 (three) times daily., Disp: 180 capsule, Rfl: 0   ibuprofen (ADVIL) 200 MG tablet, Take 600 mg by mouth every 6 (six) hours as needed for fever, headache or mild pain., Disp: , Rfl:    levothyroxine (SYNTHROID) 25 MCG tablet, TAKE 1 TABLET(25 MCG) BY MOUTH DAILY, Disp: 90 tablet, Rfl: 0   nicotine  (NICODERM CQ - DOSED IN MG/24 HOURS) 14 mg/24hr patch, Place 1 patch (14 mg total) onto the skin daily., Disp: 14 patch, Rfl: 0   promethazine-dextromethorphan (PROMETHAZINE-DM) 6.25-15 MG/5ML syrup, Take 5 mLs by mouth 4 (four) times daily as needed for cough., Disp: 118 mL, Rfl: 0   QUEtiapine (SEROQUEL) 50 MG tablet, Take 1 tablet (50 mg total) by mouth at bedtime., Disp: 30 tablet, Rfl: 0   sertraline (ZOLOFT) 50 MG tablet, Take 1 tablet (50 mg total) by mouth daily., Disp: 30 tablet, Rfl: 3   benzonatate (TESSALON PERLES) 100 MG capsule, Take 1 capsule (100 mg total) by mouth 3 (three) times daily as needed for cough. (Patient not taking: Reported on 04/04/2023), Disp: 20 capsule, Rfl: 0   predniSONE (STERAPRED UNI-PAK 21 TAB) 10 MG (21) TBPK tablet, Use as directed. (Patient not taking: Reported on 04/04/2023), Disp: 21 each, Rfl: 0  Allergies  Allergen Reactions   Septra [Sulfamethoxazole-Trimethoprim] Hives and Rash    Objective:   BP 122/82   Pulse 76   Temp 98.3 F (36.8 C) (Oral)   Ht 5\' 5"  (1.651 m)   Wt 202 lb (91.6 kg)   LMP 04/04/2023   SpO2 98%   BMI 33.61 kg/m      04/04/2023    2:06 PM 03/18/2023   10:52 AM 11/22/2022   11:00 AM  Vitals with BMI  Height 5\' 5"  5' 5.5" 5' 5.5"  Weight 202 lbs  206 lbs 6 oz 194 lbs  BMI 33.61 33.81 31.78  Systolic 122 120 027  Diastolic 82 80 59  Pulse 76 91 64     Physical Exam Vitals and nursing note reviewed.  Constitutional:      Appearance: Normal appearance. She is obese.  HENT:     Head: Normocephalic and atraumatic.  Skin:    General: Skin is warm and dry.  Neurological:     General: No focal deficit present.     Mental Status: She is alert and oriented to person, place, and time. Mental status is at baseline.  Psychiatric:        Mood and Affect: Mood normal. Affect is flat.        Behavior: Behavior normal.        Thought Content: Thought content normal.        Judgment: Judgment normal.     Assessment & Plan:   BMI 33.0-33.9,adult -     COMPLETE METABOLIC PANEL WITH GFR -     CBC with Differential/Platelet -     TSH -     LDL cholesterol, direct  Class 1 obesity due to excess calories without serious comorbidity with body mass index (BMI) of 33.0 to 33.9 in adult Assessment & Plan: Counseled on importance of weight management for overall health. Encouraged to speak with her psychiatrist about medication management and impulse control. Encouraged low calorie, heart healthy diet and moderate intensity exercise 150 minutes weekly. This is 3-5 times weekly for 30-50 minutes each session. Goal should be pace of 3 miles/hours, or walking 1.5 miles in 30 minutes and include strength training. Discussed risks of obesity. Declines MWM.   Orders: -     COMPLETE METABOLIC PANEL WITH GFR -     CBC with Differential/Platelet -     TSH -     LDL cholesterol, direct  Elevated TSH -     TSH  Need for vaccination -     Pneumococcal conjugate vaccine 20-valent     Follow up plan: Return in about 1 week (around 04/11/2023), or CPE, weight.  Park Meo, FNP

## 2023-04-06 ENCOUNTER — Other Ambulatory Visit: Payer: Self-pay | Admitting: Family Medicine

## 2023-04-06 ENCOUNTER — Encounter: Payer: Self-pay | Admitting: Family Medicine

## 2023-04-06 DIAGNOSIS — B1920 Unspecified viral hepatitis C without hepatic coma: Secondary | ICD-10-CM

## 2023-04-06 LAB — CBC WITH DIFFERENTIAL/PLATELET
Absolute Lymphocytes: 1708 {cells}/uL (ref 850–3900)
Absolute Monocytes: 365 {cells}/uL (ref 200–950)
Basophils Absolute: 51 {cells}/uL (ref 0–200)
Basophils Relative: 0.7 %
Eosinophils Absolute: 22 {cells}/uL (ref 15–500)
Eosinophils Relative: 0.3 %
HCT: 42.8 % (ref 35.0–45.0)
Hemoglobin: 13.5 g/dL (ref 11.7–15.5)
MCH: 26 pg — ABNORMAL LOW (ref 27.0–33.0)
MCHC: 31.5 g/dL — ABNORMAL LOW (ref 32.0–36.0)
MCV: 82.5 fL (ref 80.0–100.0)
MPV: 10.9 fL (ref 7.5–12.5)
Monocytes Relative: 5 %
Neutro Abs: 5154 {cells}/uL (ref 1500–7800)
Neutrophils Relative %: 70.6 %
Platelets: 307 10*3/uL (ref 140–400)
RBC: 5.19 10*6/uL — ABNORMAL HIGH (ref 3.80–5.10)
RDW: 14.3 % (ref 11.0–15.0)
Total Lymphocyte: 23.4 %
WBC: 7.3 10*3/uL (ref 3.8–10.8)

## 2023-04-06 LAB — COMPLETE METABOLIC PANEL WITH GFR
AG Ratio: 1.8 (calc) (ref 1.0–2.5)
ALT: 13 U/L (ref 6–29)
AST: 18 U/L (ref 10–30)
Albumin: 4.2 g/dL (ref 3.6–5.1)
Alkaline phosphatase (APISO): 60 U/L (ref 31–125)
BUN: 7 mg/dL (ref 7–25)
CO2: 25 mmol/L (ref 20–32)
Calcium: 9.2 mg/dL (ref 8.6–10.2)
Chloride: 104 mmol/L (ref 98–110)
Creat: 0.84 mg/dL (ref 0.50–0.99)
Globulin: 2.3 g/dL (ref 1.9–3.7)
Glucose, Bld: 77 mg/dL (ref 65–99)
Potassium: 4.1 mmol/L (ref 3.5–5.3)
Sodium: 142 mmol/L (ref 135–146)
Total Bilirubin: 0.3 mg/dL (ref 0.2–1.2)
Total Protein: 6.5 g/dL (ref 6.1–8.1)
eGFR: 89 mL/min/{1.73_m2} (ref >=60)

## 2023-04-06 LAB — TSH: TSH: 2.2 m[IU]/L

## 2023-04-06 LAB — LDL CHOLESTEROL, DIRECT: Direct LDL: 117 mg/dL — ABNORMAL HIGH (ref <100)

## 2023-04-12 ENCOUNTER — Other Ambulatory Visit: Payer: Self-pay | Admitting: Family Medicine

## 2023-04-14 ENCOUNTER — Ambulatory Visit: Payer: No Typology Code available for payment source | Admitting: Family Medicine

## 2023-05-04 IMAGING — CR DG CHEST 1V
1 series · 1 of 1 positions shown · non-contrast
Comparison: Portable chest 01/06/2021 and earlier.

CLINICAL DATA: 40-year-old female with chest and upper abdominal
pain. Drug use.

EXAM:
CHEST  1 VIEW

[chest ap]
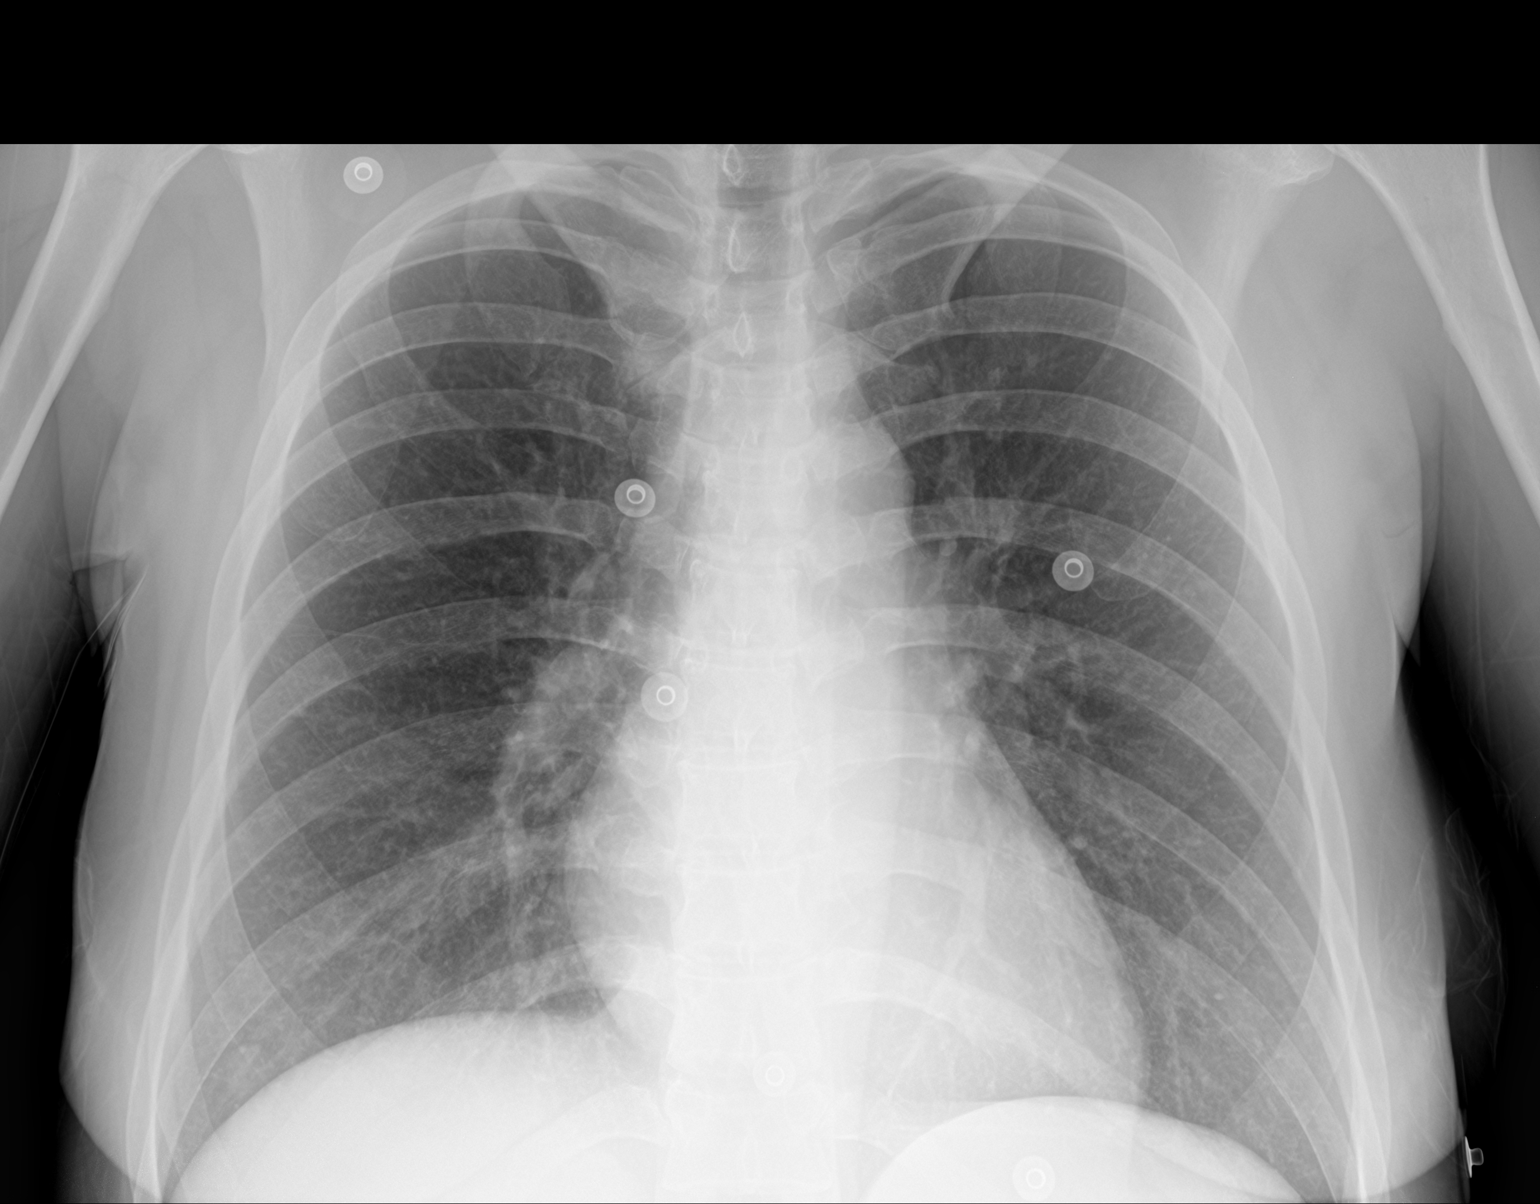

[1 of 1 positions shown; findings below may reference images not displayed]

FINDINGS: AP view at 1815 hours. Lung volumes and mediastinal contours are
within normal limits. Visualized tracheal air column is within
normal limits. Allowing for portable technique the lungs are clear.
No pneumothorax or pleural effusion. No osseous abnormality
identified.
IMPRESSION: Negative portable chest.

## 2023-05-18 ENCOUNTER — Other Ambulatory Visit: Payer: Self-pay | Admitting: Family Medicine

## 2023-05-18 DIAGNOSIS — Z1231 Encounter for screening mammogram for malignant neoplasm of breast: Secondary | ICD-10-CM

## 2023-06-08 ENCOUNTER — Ambulatory Visit: Admitting: Family Medicine

## 2023-06-15 ENCOUNTER — Encounter: Payer: Self-pay | Admitting: Family Medicine

## 2023-06-15 ENCOUNTER — Ambulatory Visit (INDEPENDENT_AMBULATORY_CARE_PROVIDER_SITE_OTHER): Payer: MEDICAID | Admitting: Family Medicine

## 2023-06-15 VITALS — BP 118/80 | HR 80 | Resp 16 | Ht 67.0 in | Wt 203.0 lb

## 2023-06-15 DIAGNOSIS — Z113 Encounter for screening for infections with a predominantly sexual mode of transmission: Secondary | ICD-10-CM

## 2023-06-15 DIAGNOSIS — B1920 Unspecified viral hepatitis C without hepatic coma: Secondary | ICD-10-CM | POA: Diagnosis not present

## 2023-06-15 DIAGNOSIS — S0532XS Ocular laceration without prolapse or loss of intraocular tissue, left eye, sequela: Secondary | ICD-10-CM | POA: Diagnosis not present

## 2023-06-15 NOTE — Assessment & Plan Note (Signed)
 STI screening done today. Will treat as indicated.

## 2023-06-15 NOTE — Assessment & Plan Note (Signed)
 Referral to ophthalmology. No new concerns or symptoms.

## 2023-06-15 NOTE — Progress Notes (Addendum)
 Subjective:  HPI: Abigail Rosario is a 43 y.o. female presenting on 06/15/2023 for No chief complaint on file.   HPI Patient is in today for possible STI exposure. Abigail Rosario is concerned about a recent sexual partner and would like testing. Has know Hepatitis C. No symptoms or specific known exposure. She is also concerned about Hepatitis C treatment, was previously referred to GI and has not been scheduled for this. In addition she requests referral to ophthalmology for treatment of her left eye prior globe rupture sequela, she was due for surgery but the ophthalmology office she was being seen is now closed. Would like new referral.   Review of Systems  All other systems reviewed and are negative.   Relevant past medical history reviewed and updated as indicated.   Past Medical History:  Diagnosis Date   Drug abuse (HCC)    Hepatitis C      Past Surgical History:  Procedure Laterality Date   RUPTURED GLOBE EXPLORATION AND REPAIR Left 01/18/2020   Procedure: REPAIR OF RUPTURED GLOBE;  Surgeon: Marcelline Deist, MD;  Location: [facility] OR;  Service: Ophthalmology;  Laterality: Left;    Allergies and medications reviewed and updated.   Current Outpatient Medications:    albuterol (VENTOLIN HFA) 108 (90 Base) MCG/ACT inhaler, Inhale 1-2 puffs into the lungs every 6 (six) hours as needed., Disp: 8 g, Rfl: 0   benzonatate (TESSALON PERLES) 100 MG capsule, Take 1 capsule (100 mg total) by mouth 3 (three) times daily as needed for cough., Disp: 20 capsule, Rfl: 0   cyclobenzaprine (FLEXERIL) 5 MG tablet, Take 1 tablet (5 mg total) by mouth 3 (three) times daily as needed for muscle spasms. At night time for TMJ pain (can cause sedation)., Disp: 30 tablet, Rfl: 0   gabapentin (NEURONTIN) 100 MG capsule, Take 2 capsules (200 mg total) by mouth 3 (three) times daily., Disp: 180 capsule, Rfl: 0   ibuprofen (ADVIL) 200 MG tablet, Take 600 mg by mouth every 6 (six) hours as needed for fever,  headache or mild pain., Disp: , Rfl:    levothyroxine (SYNTHROID) 25 MCG tablet, TAKE 1 TABLET(25 MCG) BY MOUTH DAILY, Disp: 90 tablet, Rfl: 0   nicotine (NICODERM CQ - DOSED IN MG/24 HOURS) 14 mg/24hr patch, Place 1 patch (14 mg total) onto the skin daily., Disp: 14 patch, Rfl: 0   predniSONE (STERAPRED UNI-PAK 21 TAB) 10 MG (21) TBPK tablet, Use as directed., Disp: 21 each, Rfl: 0   promethazine-dextromethorphan (PROMETHAZINE-DM) 6.25-15 MG/5ML syrup, Take 5 mLs by mouth 4 (four) times daily as needed for cough., Disp: 118 mL, Rfl: 0   QUEtiapine (SEROQUEL) 50 MG tablet, Take 1 tablet (50 mg total) by mouth at bedtime., Disp: 30 tablet, Rfl: 0   sertraline (ZOLOFT) 50 MG tablet, Take 1 tablet (50 mg total) by mouth daily., Disp: 30 tablet, Rfl: 3  Allergies  Allergen Reactions   Septra [Sulfamethoxazole-Trimethoprim] Hives and Rash    Objective:   BP 118/80   Pulse 80   Resp 16   Ht 5\' 7"  (1.702 m)   Wt 203 lb (92.1 kg)   SpO2 97%   BMI 31.79 kg/m      06/15/2023    9:44 AM 04/04/2023    2:06 PM 03/18/2023   10:52 AM  Vitals with BMI  Height 5\' 7"  5\' 5"  5' 5.5"  Weight 203 lbs 202 lbs 206 lbs 6 oz  BMI 31.79 33.61 33.81  Systolic 118 122 604  Diastolic 80 82 80  Pulse 80 76 91     Physical Exam Vitals and nursing note reviewed.  Constitutional:      Appearance: Normal appearance. She is normal weight.  HENT:     Head: Normocephalic and atraumatic.  Eyes:     Extraocular Movements:     Left eye: Abnormal extraocular motion present.  Skin:    General: Skin is warm and dry.  Neurological:     General: No focal deficit present.     Mental Status: She is alert and oriented to person, place, and time. Mental status is at baseline.  Psychiatric:        Mood and Affect: Mood normal.        Behavior: Behavior normal.        Thought Content: Thought content normal.        Judgment: Judgment normal.     Assessment & Plan:  Routine screening for STI (sexually  transmitted infection) Assessment & Plan: STI screening done today. Will treat as indicated.   Orders: -     HIV Antibody (routine testing w rflx) -     RPR -     C. trachomatis/N. gonorrhoeae RNA -     Trichomonas vaginalis, RNA  Ruptured globe of left eye, sequela Assessment & Plan: Referral to ophthalmology. No new concerns or symptoms.   Orders: -     Ambulatory referral to Ophthalmology  Hepatitis C virus infection without hepatic coma, unspecified chronicity Assessment & Plan: Chronic not previously treated. Working with referral coordinator on GI referral.   Orders: -     Ambulatory referral to Gastroenterology     Follow up plan: Return for annual physical with labs 1 week prior.  Jenelle Mis, FNP

## 2023-06-15 NOTE — Addendum Note (Signed)
 Addended by: Jenelle Mis on: 06/15/2023 10:32 AM   Modules accepted: Orders

## 2023-06-15 NOTE — Assessment & Plan Note (Signed)
 Chronic not previously treated. Working with referral coordinator on GI referral.

## 2023-06-16 ENCOUNTER — Ambulatory Visit
Admission: RE | Admit: 2023-06-16 | Discharge: 2023-06-16 | Disposition: A | Discharge status: Home / Self Care | Payer: MEDICAID | Source: Ambulatory Visit | Attending: Family Medicine | Admitting: Family Medicine

## 2023-06-16 DIAGNOSIS — Z1231 Encounter for screening mammogram for malignant neoplasm of breast: Secondary | ICD-10-CM

## 2023-06-16 LAB — HIV ANTIBODY (ROUTINE TESTING W REFLEX): HIV 1&2 Ab, 4th Generation: NONREACTIVE

## 2023-06-16 LAB — TRICHOMONAS VAGINALIS, PROBE AMP: Trichomonas vaginalis RNA: NOT DETECTED

## 2023-06-16 LAB — C. TRACHOMATIS/N. GONORRHOEAE RNA
C. trachomatis RNA, TMA: NOT DETECTED
N. gonorrhoeae RNA, TMA: NOT DETECTED

## 2023-06-16 LAB — RPR: RPR Ser Ql: NONREACTIVE

## 2023-10-06 ENCOUNTER — Encounter: Payer: Self-pay | Admitting: Family Medicine

## 2023-10-12 ENCOUNTER — Ambulatory Visit: Payer: MEDICAID | Admitting: Family Medicine

## 2023-11-08 ENCOUNTER — Ambulatory Visit: Payer: MEDICAID | Admitting: Family Medicine

## 2023-11-09 ENCOUNTER — Ambulatory Visit: Payer: MEDICAID | Admitting: Family Medicine

## 2023-11-13 ENCOUNTER — Encounter (HOSPITAL_COMMUNITY): Payer: Self-pay

## 2023-11-13 ENCOUNTER — Ambulatory Visit (HOSPITAL_COMMUNITY)
Admission: EM | Admit: 2023-11-13 | Discharge: 2023-11-13 | Disposition: A | Discharge status: Home / Self Care | Payer: MEDICAID

## 2023-11-13 DIAGNOSIS — M25512 Pain in left shoulder: Secondary | ICD-10-CM | POA: Diagnosis not present

## 2023-11-13 DIAGNOSIS — R519 Headache, unspecified: Secondary | ICD-10-CM

## 2023-11-13 MED ORDER — DEXAMETHASONE SODIUM PHOSPHATE 10 MG/ML IJ SOLN
10.0000 mg | Freq: Once | INTRAMUSCULAR | Status: AC
  Administered 2023-11-13: 10 mg via INTRAMUSCULAR

## 2023-11-13 MED ORDER — METOCLOPRAMIDE HCL 5 MG/ML IJ SOLN
INTRAMUSCULAR | Status: AC
  Filled 2023-11-13: qty 2

## 2023-11-13 MED ORDER — METOCLOPRAMIDE HCL 5 MG/ML IJ SOLN
5.0000 mg | Freq: Once | INTRAMUSCULAR | Status: AC
  Administered 2023-11-13: 5 mg via INTRAMUSCULAR

## 2023-11-13 MED ORDER — KETOROLAC TROMETHAMINE 30 MG/ML IJ SOLN
30.0000 mg | Freq: Once | INTRAMUSCULAR | Status: AC
  Administered 2023-11-13: 30 mg via INTRAMUSCULAR

## 2023-11-13 MED ORDER — DEXAMETHASONE SODIUM PHOSPHATE 10 MG/ML IJ SOLN
INTRAMUSCULAR | Status: AC
  Filled 2023-11-13: qty 1

## 2023-11-13 MED ORDER — METHOCARBAMOL 500 MG PO TABS: 500.0000 mg | ORAL_TABLET | Freq: Two times a day (BID) | ORAL | 0 refills | Status: DC

## 2023-11-13 MED ORDER — KETOROLAC TROMETHAMINE 30 MG/ML IJ SOLN
INTRAMUSCULAR | Status: AC
  Filled 2023-11-13: qty 1

## 2023-11-13 NOTE — ED Triage Notes (Signed)
 Patient here today with c/o left shoulder pain that radiates down left arm X 1 week. Patient started having a migraine the past 2 days. Patient has tried taking Ibuprofen with no relief. Patient has also had some nausea. Patient states that she has a h/o a rotator cuff injury.

## 2023-11-13 NOTE — Discharge Instructions (Signed)
 For your bad headache we have given you a mixture of medications.  This medication should also help with your shoulder pain.  Some aspect of your shoulder pain may be muscular, take the muscle relaxers up to 2 times daily.  Do not drink alcohol or drive on these as they may cause drowsiness or sedation.  If your headache is not improved with these medications seek further care at the nearest emergency department.  If shoulder pain persist follow-up with orthopedics.

## 2023-11-13 NOTE — ED Provider Notes (Signed)
 [facility]    CSN: [id number] Arrival date & time: 11/13/23  1716      History   Chief Complaint Chief Complaint  Patient presents with   Shoulder Pain    HPI Abigail Rosario is a 43 y.o. female.   Patient presents to clinic over concern of left shoulder pain that has been ongoing for the past week but gradually worsening.  Reports an old rotator cuff injury and thinks something may have triggered it, did do recent heavy lifting at work and shortly after noted she was having severe left shoulder pain.  Having to sleep on her back due to the pain.  Working in heavy lifting exacerbate the pain.  Has taken ibuprofen without improvement.  Feels like pain is radiating from her left shoulder into her arm and up into her neck.  Reproducible with palpation and range of motion.  Is unsure if the pain is giving her a migraine or if she is sick.  Has had nausea.  History of migraine headaches and this feels similar.  Endorses photophobia, phonophobia and nausea.  Without vision changes.  Has had her tubes tied, denies chance for pregnancy.  The history is provided by the patient and medical records.  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   BMI 33.0-33.9,adult 03/18/2023   Folliculitis of both axillae 11/10/2022   Routine screening for STI (sexually transmitted infection) 11/10/2022   Furuncle of left axilla 11/10/2022   Class 1 obesity due to excess calories without serious comorbidity with body mass index (BMI) of 30.0 to 30.9 in adult 09/29/2022   Bipolar 1 disorder, manic, mild (HCC) 05/13/2022   Anxiety and  fearfulness of childhood and adolescence 05/13/2022   Depression 05/13/2022   OCD (obsessive compulsive disorder) 05/13/2022   PTSD (post-traumatic stress disorder) 05/13/2022   Overweight (BMI 25.0-29.9) 05/13/2022   Physical exam, annual 03/23/2022   Nabothian cyst 03/23/2022   Ruptured globe, left eye 01/18/2020   Substance use disorder 01/10/2020   MDD (major depressive disorder) 01/09/2020   Bacteremia 02/10/2017   Abnormal urinalysis 02/07/2017   Calculus of gallbladder with acute cholecystitis without obstruction 02/07/2017   Cholecystitis, acute 02/07/2017   Elevated LDH 02/07/2017   Elevated bilirubin 02/07/2017   Elevated liver enzymes 02/07/2017   Epigastric pain 02/07/2017   Jaundice 02/07/2017   Anxiety and depression 02/07/2017    Past Surgical History:  Procedure Laterality Date   RUPTURED GLOBE EXPLORATION AND REPAIR Left 01/18/2020   Procedure: REPAIR OF RUPTURED GLOBE;  Surgeon: Regenia Prentice Clack, MD;  Location: [facility] OR;  Service: Ophthalmology;  Laterality: Left;    OB History   No obstetric history on file.      Home Medications    Prior to Admission medications   Medication Sig Start Date End Date Taking? Authorizing Provider  methocarbamol  (ROBAXIN ) 500 MG tablet Take 1 tablet (500 mg total) by mouth 2 (two) times daily. 11/13/23  Yes Myking Sar  N, FNP  prazosin (MINIPRESS) 1 MG capsule Take 1 mg by mouth at bedtime. 10/22/23  Yes [provider]  albuterol  (VENTOLIN   HFA) 108 (90 Base) MCG/ACT inhaler Inhale 1-2 puffs into the lungs every 6 (six) hours as needed. 01/14/23   Vivienne Delon HERO, PA-C  cyclobenzaprine  (FLEXERIL ) 5 MG tablet Take 1 tablet (5 mg total) by mouth 3 (three) times daily as needed for muscle spasms. At night time for TMJ pain (can cause sedation). 03/18/23   Aletha Bene, MD  gabapentin  (NEURONTIN ) 100 MG capsule Take 2 capsules (200 mg total) by mouth 3 (three) times daily. 02/28/20   Norris Will PARAS, PA-C   ibuprofen (ADVIL) 200 MG tablet Take 600 mg by mouth every 6 (six) hours as needed for fever, headache or mild pain.    [provider]  levothyroxine  (SYNTHROID ) 25 MCG tablet TAKE 1 TABLET(25 MCG) BY MOUTH DAILY 04/06/23   Kayla Jeoffrey RAMAN, FNP  nicotine  (NICODERM CQ  - DOSED IN MG/24 HOURS) 14 mg/24hr patch Place 1 patch (14 mg total) onto the skin daily. 06/29/22   Kayla Jeoffrey RAMAN, FNP  QUEtiapine  (SEROQUEL ) 50 MG tablet Take 1 tablet (50 mg total) by mouth at bedtime. 02/28/20   Norris Will PARAS, PA-C  sertraline  (ZOLOFT ) 50 MG tablet Take 1 tablet (50 mg total) by mouth daily. 05/13/22   Kayla Jeoffrey RAMAN, FNP    Family History History reviewed. No pertinent family history.  Social History Social History   Tobacco Use   Smoking status: Every Day    Current packs/day: 1.00    Average packs/day: 1 pack/day for 10.0 years (10.0 ttl pk-yrs)    Types: Cigarettes   Smokeless tobacco: Never  Vaping Use   Vaping status: Never Used  Substance Use Topics   Alcohol use: Not Currently   Drug use: Not Currently    Types: Methamphetamines, Marijuana     Allergies   Septra [sulfamethoxazole-trimethoprim]   Review of Systems Review of Systems  Per HPI  Physical Exam Triage Vital Signs ED Triage Vitals  Encounter Vitals Group     BP 11/13/23 1759 106/63     Girls Systolic BP Percentile --      Girls Diastolic BP Percentile --      Boys Systolic BP Percentile --      Boys Diastolic BP Percentile --      Pulse Rate 11/13/23 1759 61     Resp 11/13/23 1759 16     Temp 11/13/23 1759 98.2 F (36.8 C)     Temp Source 11/13/23 1759 Oral     SpO2 11/13/23 1759 96 %     Weight --      Height --      Head Circumference --      Peak Flow --      Pain Score 11/13/23 1802 10     Pain Loc --      Pain Education --      Exclude from Growth Chart --    No data found.  Updated Vital Signs BP 106/63 (BP Location: Right Arm)   Pulse 61   Temp 98.2 F (36.8 C) (Oral)   Resp  16   LMP 11/11/2023 (Approximate)   SpO2 96%   Visual Acuity Right Eye Distance:   Left Eye Distance:   Bilateral Distance:    Right Eye Near:   Left Eye Near:    Bilateral Near:     Physical Exam Vitals and nursing note reviewed.  Constitutional:      Appearance: Normal appearance.  HENT:     Head: Normocephalic and atraumatic.     Right Ear:  External ear normal.     Left Ear: External ear normal.     Nose: Nose normal.     Mouth/Throat:     Mouth: Mucous membranes are moist.  Eyes:     Conjunctiva/sclera: Conjunctivae normal.  Cardiovascular:     Rate and Rhythm: Normal rate and regular rhythm.     Heart sounds: Normal heart sounds. No murmur heard. Pulmonary:     Effort: Pulmonary effort is normal. No respiratory distress.  Skin:    General: Skin is warm and dry.  Neurological:     General: No focal deficit present.     Mental Status: She is alert and oriented to person, place, and time.  Psychiatric:        Mood and Affect: Mood normal.        Behavior: Behavior normal.      UC Treatments / Results  Labs (all labs ordered are listed, but only abnormal results are displayed) Labs Reviewed - No data to display  EKG   Radiology No results found.  Procedures Procedures (including critical care time)  Medications Ordered in UC Medications  ketorolac  (TORADOL ) 30 MG/ML injection 30 mg (has no administration in time range)  metoCLOPramide  (REGLAN ) injection 5 mg (has no administration in time range)  dexamethasone  (DECADRON ) injection 10 mg (has no administration in time range)    Initial Impression / Assessment and Plan / UC Course  I have reviewed the triage vital signs and the nursing notes.  Pertinent labs & imaging results that were available during my care of the patient were reviewed by me and considered in my medical decision making (see chart for details).  Vitals and triage reviewed, patient is hemodynamically stable.  Heart with regular  rate and rhythm, lungs vesicular.  Positive empty can testing for left shoulder, could be rotator cuff etiology.  Diffuse left shoulder, neck and scapular tenderness to palpation, also could be muscular.  Will trial muscle relaxer.  Bad headache consistent with previous migraines.  Will trial migraine cocktail, suspect steroid and Toradol  will help with shoulder pain.  Reglan  will help with nausea.  Plan of care, follow-up care return precautions given, no questions at this time.     Final Clinical Impressions(s) / UC Diagnoses   Final diagnoses:  Acute pain of left shoulder  Bad headache     Discharge Instructions      For your bad headache we have given you a mixture of medications.  This medication should also help with your shoulder pain.  Some aspect of your shoulder pain may be muscular, take the muscle relaxers up to 2 times daily.  Do not drink alcohol or drive on these as they may cause drowsiness or sedation.  If your headache is not improved with these medications seek further care at the nearest emergency department.  If shoulder pain persist follow-up with orthopedics.     ED Prescriptions     Medication Sig Dispense Auth. Provider   methocarbamol  (ROBAXIN ) 500 MG tablet Take 1 tablet (500 mg total) by mouth 2 (two) times daily. 20 tablet Dreama, Nickolus Wadding  N, FNP      PDMP not reviewed this encounter.   Dreama, Ardythe Klute  N, FNP 11/13/23 1825

## 2023-11-15 ENCOUNTER — Ambulatory Visit: Payer: MEDICAID | Admitting: Family Medicine

## 2023-11-16 ENCOUNTER — Ambulatory Visit: Payer: Self-pay

## 2023-11-16 ENCOUNTER — Other Ambulatory Visit: Payer: Self-pay

## 2023-11-16 ENCOUNTER — Ambulatory Visit: Payer: MEDICAID | Admitting: Internal Medicine

## 2023-11-16 ENCOUNTER — Encounter: Payer: Self-pay | Admitting: Internal Medicine

## 2023-11-16 VITALS — BP 108/76 | Ht 67.0 in | Wt 180.0 lb

## 2023-11-16 DIAGNOSIS — M25512 Pain in left shoulder: Secondary | ICD-10-CM

## 2023-11-16 MED ORDER — METHYLPREDNISOLONE ACETATE 40 MG/ML IJ SUSP
40.0000 mg | Freq: Once | INTRAMUSCULAR | Status: AC
  Administered 2023-11-16: 40 mg via INTRA_ARTICULAR

## 2023-11-16 NOTE — Progress Notes (Cosign Needed)
 PCP: Kayla Jeoffrey RAMAN, FNP  Subjective:   HPI: Patient is a 43 y.o. female here for acute left shoulder pain.  Pain began around 2 weeks ago after lifting heavy boxes at work.  She does not recall a pop or tear followed by onset of sharp pain, but notes that pain began one evening after work.  She endorses pain across the anterior, superior, and lateral aspects of the shoulder.  She describes night pain.  Pain is worse with overactivity in general as well as with internal rotation.  She went to urgent care on 9/14 endorsing pain and received IM Toradol  as well as a prescription for Robaxin .  Unfortunately these  measures have not significantly improved her pain.  She denies numbness or weakness in the left upper extremity.  Of note, she says that this has happened multiple times previously but she has never been evaluated by sports medicine/orthopedic surgery and has not had any recent imaging of the left shoulder completed.  Past Medical History:  Diagnosis Date   Drug abuse (HCC)    Hepatitis C     Current Outpatient Medications on File Prior to Visit  Medication Sig Dispense Refill   albuterol  (VENTOLIN  HFA) 108 (90 Base) MCG/ACT inhaler Inhale 1-2 puffs into the lungs every 6 (six) hours as needed. 8 g 0   cyclobenzaprine  (FLEXERIL ) 5 MG tablet Take 1 tablet (5 mg total) by mouth 3 (three) times daily as needed for muscle spasms. At night time for TMJ pain (can cause sedation). 30 tablet 0   gabapentin  (NEURONTIN ) 100 MG capsule Take 2 capsules (200 mg total) by mouth 3 (three) times daily. 180 capsule 0   ibuprofen (ADVIL) 200 MG tablet Take 600 mg by mouth every 6 (six) hours as needed for fever, headache or mild pain.     levothyroxine  (SYNTHROID ) 25 MCG tablet TAKE 1 TABLET(25 MCG) BY MOUTH DAILY 90 tablet 0   methocarbamol  (ROBAXIN ) 500 MG tablet Take 1 tablet (500 mg total) by mouth 2 (two) times daily. 20 tablet 0   nicotine  (NICODERM CQ  - DOSED IN MG/24 HOURS) 14 mg/24hr patch Place 1  patch (14 mg total) onto the skin daily. 14 patch 0   prazosin (MINIPRESS) 1 MG capsule Take 1 mg by mouth at bedtime.     QUEtiapine  (SEROQUEL ) 50 MG tablet Take 1 tablet (50 mg total) by mouth at bedtime. 30 tablet 0   sertraline  (ZOLOFT ) 50 MG tablet Take 1 tablet (50 mg total) by mouth daily. 30 tablet 3   No current facility-administered medications on file prior to visit.    Past Surgical History:  Procedure Laterality Date   RUPTURED GLOBE EXPLORATION AND REPAIR Left 01/18/2020   Procedure: REPAIR OF RUPTURED GLOBE;  Surgeon: Regenia Prentice Clack, MD;  Location: [facility] OR;  Service: Ophthalmology;  Laterality: Left;    Allergies  Allergen Reactions   Septra [Sulfamethoxazole-Trimethoprim] Hives and Rash    LMP 11/11/2023 (Approximate)       No data to display              No data to display              Objective:  Physical Exam:  Gen: NAD, comfortable in exam room  Left shoulder No swelling, ecchymoses.  No gross deformity. TTP over the left trapezius Reduced range of motion secondary to pain, 0-130 degrees of active forward flexion abduction but can passively reach 165 degrees, internal rotation to L3, 45 degrees external rotation  Positive empty can, pain is elicited with resisted ER and IR Equivocal Hawkins and Neers Strength 3/5 NV intact distally.   MSK US  Left Shoulder Complete MSK u/s left shoulder: Biceps tendon: Normal appearance Pec major tendon: Normal appearance Subscapularis: Normal appearance AC joint: Normal appearance Infraspinatus: Normal appearance Supraspinatus: There is increased hypoechogenicity of the subacromial-subdeltoid bursa, no tear identified Posterior glenohumeral joint: Normal appearance  Impression: Subacromial bursitis suggestive of supraspinatus tendinopathy.  No RTC tears identified.   Assessment & Plan:  1.  Left shoulder pain 2-week history of left shoulder pain, exacerbated by overhead activity.  Ultrasound of  left shoulder today shows subacromial bursitis suggestive of supraspinatus tendinopathy.  There were otherwise no acute findings noted.  Treatment options were reviewed with the patient.  She elected to proceed with subacromial steroid injection for pain relief.  This was completed without complication.  She was also given a home exercise program to focus on rotator cuff rehabilitation and strengthening.  She will follow-up if pain worsens or fails to improve.  Left Shoulder Subacromial Steroid Injection PROCEDURE:  Risks & benefits of left shoulder subacromial injection reviewed. Consent obtained. Time-out completed. Patient prepped and draped in the normal fashion. Area cleansed with alcohol. Ethyl chloride spray used to anesthetize the skin. Solution of 3 mL 1% lidocaine  with 1 mL methylprednisolone  (Depo-medrol ) 40mg /mL injected into the left subacromial space using a 25-gauge 1.5-inch needle via the posterior approach. Patient tolerated procedure well without any complications. Area covered with adhesive bandage. Post-procedure care reviewed. All questions answered.

## 2023-11-16 NOTE — Telephone Encounter (Signed)
 FYI Only or Action Required?: FYI only for provider.  Patient was last seen in primary care on 06/15/2023 by Kayla Jeoffrey RAMAN, FNP.  Called Nurse Triage reporting Shoulder Pain.  Symptoms began several weeks ago.  Interventions attempted: Rest, hydration, or home remedies.  Symptoms are: gradually worsening.  Triage Disposition: See Today or Tomorrow in Office (overriding See PCP Within 2 Weeks)  Patient/caregiver understands and will follow disposition?: Yes Reason for Disposition  Shoulder pain is a chronic symptom (recurrent or ongoing AND present > 4 weeks)  Answer Assessment - Initial Assessment Questions Sharp, burning pain left shoulder x 2 weeks. See recent UC visit notes. States previously had positive response to injection. Confirmed location and provider provide that service per [facility], patient made aware that treatment options will be at provider's discretion. Advised to seek care in the ED or UC if pain worsens prior to visit.  1. ONSET: When did the pain start?     2 weeks, but chronic hx  2. LOCATION: Where is the pain located?     Left shoulder  3. PAIN: How bad is the pain? (Scale 1-10; or mild, moderate, severe)     8/10  4. WORK OR EXERCISE: Has there been any recent work or exercise that involved this part of the body?     Lifting boxes  Protocols used: Shoulder Pain-A-AH Copied from CRM [id number]. Topic: Clinical - Red Word Triage >> Nov 16, 2023 12:52 PM Carlatta H wrote: Red Word that prompted transfer to Nurse Triage: Right Rotator cuff pain for about 2 weeks//Pain is severe

## 2023-11-17 ENCOUNTER — Ambulatory Visit: Payer: MEDICAID | Admitting: Family Medicine

## 2023-12-07 ENCOUNTER — Ambulatory Visit (INDEPENDENT_AMBULATORY_CARE_PROVIDER_SITE_OTHER): Payer: MEDICAID | Admitting: Internal Medicine

## 2023-12-07 ENCOUNTER — Encounter: Payer: Self-pay | Admitting: Internal Medicine

## 2023-12-07 VITALS — BP 100/72 | Ht 67.0 in | Wt 180.0 lb

## 2023-12-07 DIAGNOSIS — M25512 Pain in left shoulder: Secondary | ICD-10-CM

## 2023-12-07 MED ORDER — MELOXICAM 15 MG PO TABS: 15.0000 mg | ORAL_TABLET | Freq: Every day | ORAL | 0 refills | Status: AC

## 2023-12-07 MED ORDER — NITROGLYCERIN 0.2 MG/HR TD PT24: MEDICATED_PATCH | TRANSDERMAL | 1 refills | Status: AC

## 2023-12-07 NOTE — Patient Instructions (Addendum)
 It was a pleasure to see you today.  Thank you for giving us  the opportunity to be involved in your care.  Below is a brief recap of your visit and next steps.  We will plan to see you again in 6 weeks.  Summary I will prescribe meloxicam for pain relief. Take one tablet daily x 1 week, then use as needed. We will give you instruction on how to use nitroglycerin patches, which will help with healing Recommend formal physical therapy to help with rehabilitation and strengthening. Follow up in 6 weeks.   Nitroglycerin Protocol  Apply 1/4 nitroglycerin patch to affected area daily. Change position of patch within the affected area every 24 hours. You may experience a headache during the first 1-2 weeks of using the patch, these should subside. If you experience headaches after beginning nitroglycerin patch treatment, you may take your preferred over the counter pain reliever. Another side effect of the nitroglycerin patch is skin irritation or rash related to patch adhesive. Please notify our office if you develop more severe headaches or rash, and stop the patch. Tendon healing with nitroglycerin patch may require 12 to 24 weeks depending on the extent of injury. Men should not use if taking Viagra, Cialis, or Levitra.  Do not use if you have migraines or rosacea.

## 2023-12-07 NOTE — Progress Notes (Unsigned)
 PCP: Kayla Jeoffrey RAMAN, FNP  Subjective:   HPI: Patient is a 43 y.o. female here for left shoulder pain.  She was last evaluated on 9/17 endorsing a 2-week history of diffuse pain across the left shoulder.  Pain began after lifting heavy boxes at work.  Ultrasound the left shoulder showed findings consistent with subacromial bursitis.  No rotator cuff tears were identified.  Treatment options were reviewed and she elected to proceed with subacromial steroid injection.  She was also given a home exercise program and told to follow-up if pain did not improve.  Today she states that the pain in her left shoulder improved for close to 2 weeks but is gradually getting worse.  It is not as severe as it was when she was evaluated last month, however she is concerned that it will continue to get worse.  She continues to endorse pain with overhead activity and also endorses night pain.  She cannot sleep on the left side due to discomfort.  She would like to discuss additional treatment options..  Past Medical History:  Diagnosis Date   Drug abuse (HCC)    Hepatitis C     Current Outpatient Medications on File Prior to Visit  Medication Sig Dispense Refill   albuterol  (VENTOLIN  HFA) 108 (90 Base) MCG/ACT inhaler Inhale 1-2 puffs into the lungs every 6 (six) hours as needed. 8 g 0   cyclobenzaprine  (FLEXERIL ) 5 MG tablet Take 1 tablet (5 mg total) by mouth 3 (three) times daily as needed for muscle spasms. At night time for TMJ pain (can cause sedation). 30 tablet 0   gabapentin  (NEURONTIN ) 100 MG capsule Take 2 capsules (200 mg total) by mouth 3 (three) times daily. 180 capsule 0   ibuprofen (ADVIL) 200 MG tablet Take 600 mg by mouth every 6 (six) hours as needed for fever, headache or mild pain.     levothyroxine  (SYNTHROID ) 25 MCG tablet TAKE 1 TABLET(25 MCG) BY MOUTH DAILY 90 tablet 0   methocarbamol  (ROBAXIN ) 500 MG tablet Take 1 tablet (500 mg total) by mouth 2 (two) times daily. 20 tablet 0   nicotine   (NICODERM CQ  - DOSED IN MG/24 HOURS) 14 mg/24hr patch Place 1 patch (14 mg total) onto the skin daily. 14 patch 0   prazosin (MINIPRESS) 1 MG capsule Take 1 mg by mouth at bedtime.     QUEtiapine  (SEROQUEL ) 50 MG tablet Take 1 tablet (50 mg total) by mouth at bedtime. 30 tablet 0   sertraline  (ZOLOFT ) 50 MG tablet Take 1 tablet (50 mg total) by mouth daily. 30 tablet 3   No current facility-administered medications on file prior to visit.    Past Surgical History:  Procedure Laterality Date   RUPTURED GLOBE EXPLORATION AND REPAIR Left 01/18/2020   Procedure: REPAIR OF RUPTURED GLOBE;  Surgeon: Regenia Prentice Clack, MD;  Location: [facility] OR;  Service: Ophthalmology;  Laterality: Left;    Allergies  Allergen Reactions   Septra [Sulfamethoxazole-Trimethoprim] Hives and Rash    BP 100/72   Ht 5' 7 (1.702 m)   Wt 180 lb (81.6 kg)   LMP 11/11/2023 (Approximate)   BMI 28.19 kg/m       No data to display              No data to display              Objective:  Physical Exam:  Gen: NAD, comfortable in exam room  Left Shoulder No swelling, ecchymoses.  No gross  deformity. TTP left trapezius and deltoid ROM from 0-175 degrees of active abduction and forward flexion External rotation 0-45 degrees Internal rotation to L1 Equivocal Hawkins and Neers Minimal pain with resisted ER/IR Negative Hawkins, Neers. Negative Yergasons. Strength 4/5 with empty can and resisted internal/external rotation. NV intact distally.    Assessment & Plan:  1. Left Shoulder Pain Pain is starting to worsen again.  Ultrasound findings from last month showed subacromial bursitis.  Range of motion and strength have improved compared to previous exam, but are still mildly compromised due to pain.  Additional treatment options reviewed today.  Recommend formal physical therapy, nitroglycerin patches, and meloxicam for pain relief. NTG patch protocol given in printed form today. It is too soon for  repeat injection.  She will follow-up in 6 weeks for reassessment.

## 2024-01-09 ENCOUNTER — Other Ambulatory Visit: Payer: Self-pay | Admitting: Internal Medicine

## 2024-01-09 DIAGNOSIS — M25512 Pain in left shoulder: Secondary | ICD-10-CM

## 2024-01-17 ENCOUNTER — Encounter: Payer: Self-pay | Admitting: Family Medicine

## 2024-01-17 ENCOUNTER — Ambulatory Visit (INDEPENDENT_AMBULATORY_CARE_PROVIDER_SITE_OTHER): Payer: MEDICAID | Admitting: Family Medicine

## 2024-01-17 VITALS — BP 122/76 | HR 68 | Temp 97.9°F | Ht 67.0 in | Wt 182.0 lb

## 2024-01-17 DIAGNOSIS — J069 Acute upper respiratory infection, unspecified: Secondary | ICD-10-CM

## 2024-01-17 LAB — INFLUENZA A AND B AG, IMMUNOASSAY
INFLUENZA A ANTIGEN: NOT DETECTED
INFLUENZA B ANTIGEN: NOT DETECTED

## 2024-01-17 MED ORDER — IPRATROPIUM-ALBUTEROL 0.5-2.5 (3) MG/3ML IN SOLN: 3.0000 mL | Freq: Once | RESPIRATORY_TRACT | Status: AC

## 2024-01-17 MED ORDER — BENZONATATE 200 MG PO CAPS: 200.0000 mg | ORAL_CAPSULE | Freq: Two times a day (BID) | ORAL | 0 refills | Status: AC | PRN

## 2024-01-17 MED ORDER — ALBUTEROL SULFATE HFA 108 (90 BASE) MCG/ACT IN AERS: 1.0000 | INHALATION_SPRAY | Freq: Four times a day (QID) | RESPIRATORY_TRACT | 0 refills | Status: AC | PRN

## 2024-01-17 MED ORDER — HYDROCODONE BIT-HOMATROP MBR 5-1.5 MG/5ML PO SOLN: 5.0000 mL | Freq: Three times a day (TID) | ORAL | 0 refills | Status: DC | PRN

## 2024-01-17 NOTE — Assessment & Plan Note (Signed)
 Flu negative. Cough and wheeze improved in office with albuterol  nebulizer administration.  Reassured patient that symptoms and exam findings are most consistent with a viral upper respiratory infection and explained lack of efficacy of antibiotics against viruses.  Discussed expected course and features suggestive of secondary bacterial infection.  Continue supportive care. Increase fluid intake with water  or electrolyte solution like pedialyte. Encouraged acetaminophen  as needed for fever/pain. Encouraged salt water  gargling, chloraseptic spray and throat lozenges. Encouraged OTC guaifenesin. Encouraged saline sinus flushes and/or neti with humidified air.  May use Benzonatate  PRN for cough.  Follow up if symptoms persist or worsen.

## 2024-01-17 NOTE — Progress Notes (Signed)
 Acute Office Visit  Patient ID: Abigail Rosario, female    DOB: 09-22-1980, 43 y.o.   MRN: [id number]  PCP: Kayla Jeoffrey RAMAN, FNP  Chief Complaint  Patient presents with   Acute Visit    Wet Cough, says she feels like her chest is on fire, sinus and chest congestion , wheezing, weak, change of body temps. X 3 days will do covid flu and rsv test today      Subjective:     HPI  Discussed the use of AI scribe software for clinical note transcription with the patient, who gave verbal consent to proceed.  History of Present Illness A 43 year old female presents with persistent cough.  She has been experiencing persistent cough, wheezing with chest burning, clear rhinorrhea for the past two to three days. Subjective fever is described as 'hot and cold'.  She has been coughing frequently, particularly in the morning, and has been sneezing and producing phlegm. No known exposure to sick contacts and has not taken a COVID test yet.  She has not checked her temperature. She has been using Vicks cough drops but has not taken any cough syrup or Mucinex.  No shortness of breath or palpitations, chest pain, pleurisy   Review of Systems  All other systems reviewed and are negative.   Past Medical History:  Diagnosis Date   Anxiety    Blood transfusion without reported diagnosis    Depression    Drug abuse (HCC)    Hepatitis C     Past Surgical History:  Procedure Laterality Date   EYE SURGERY     FRACTURE SURGERY     RUPTURED GLOBE EXPLORATION AND REPAIR Left 01/18/2020   Procedure: REPAIR OF RUPTURED GLOBE;  Surgeon: Regenia Prentice Clack, MD;  Location: [facility] OR;  Service: Ophthalmology;  Laterality: Left;   TUBAL LIGATION      Current Outpatient Medications on File Prior to Visit  Medication Sig Dispense Refill   cyclobenzaprine  (FLEXERIL ) 5 MG tablet Take 1 tablet (5 mg total) by mouth 3 (three) times daily as needed for muscle spasms. At night time for TMJ pain (can cause  sedation). 30 tablet 0   gabapentin  (NEURONTIN ) 100 MG capsule Take 2 capsules (200 mg total) by mouth 3 (three) times daily. 180 capsule 0   levothyroxine  (SYNTHROID ) 25 MCG tablet TAKE 1 TABLET(25 MCG) BY MOUTH DAILY 90 tablet 0   meloxicam (MOBIC) 15 MG tablet Take 1 tablet (15 mg total) by mouth daily. 30 tablet 0   methocarbamol  (ROBAXIN ) 500 MG tablet Take 1 tablet (500 mg total) by mouth 2 (two) times daily. 20 tablet 0   nicotine  (NICODERM CQ  - DOSED IN MG/24 HOURS) 14 mg/24hr patch Place 1 patch (14 mg total) onto the skin daily. 14 patch 0   nitroGLYCERIN (NITRODUR - DOSED IN MG/24 HR) 0.2 mg/hr patch Use 1/4 patch daily to the affected area. 30 patch 1   prazosin (MINIPRESS) 1 MG capsule Take 1 mg by mouth at bedtime.     QUEtiapine  (SEROQUEL ) 50 MG tablet Take 1 tablet (50 mg total) by mouth at bedtime. 30 tablet 0   sertraline  (ZOLOFT ) 50 MG tablet Take 1 tablet (50 mg total) by mouth daily. 30 tablet 3   No current facility-administered medications on file prior to visit.    Allergies  Allergen Reactions   Septra [Sulfamethoxazole-Trimethoprim] Hives and Rash       Objective:    BP 122/76   Pulse 68   Temp 97.9  F (36.6 C)   Ht 5' 7 (1.702 m)   Wt 182 lb 0.3 oz (82.6 kg)   LMP 01/08/2024 (Approximate)   SpO2 97%   BMI 28.51 kg/m    Physical Exam Vitals and nursing note reviewed.  Constitutional:      Appearance: Normal appearance. She is normal weight.  HENT:     Head: Normocephalic and atraumatic.     Right Ear: Tympanic membrane, ear canal and external ear normal.     Left Ear: Tympanic membrane, ear canal and external ear normal.     Nose: Nose normal.     Right Sinus: No maxillary sinus tenderness or frontal sinus tenderness.     Left Sinus: No maxillary sinus tenderness or frontal sinus tenderness.     Mouth/Throat:     Mouth: Mucous membranes are moist.     Pharynx: Oropharynx is clear.     Comments: hoarse Eyes:     Extraocular Movements:  Extraocular movements intact.     Conjunctiva/sclera: Conjunctivae normal.  Cardiovascular:     Rate and Rhythm: Normal rate and regular rhythm.     Pulses: Normal pulses.     Heart sounds: Normal heart sounds.  Pulmonary:     Effort: Pulmonary effort is normal.     Breath sounds: Wheezing present.     Comments: Persistent cough Musculoskeletal:     Cervical back: No tenderness.  Lymphadenopathy:     Cervical: No cervical adenopathy.  Skin:    General: Skin is warm and dry.  Neurological:     General: No focal deficit present.     Mental Status: She is alert and oriented to person, place, and time. Mental status is at baseline.  Psychiatric:        Mood and Affect: Mood normal.        Behavior: Behavior normal.        Thought Content: Thought content normal.        Judgment: Judgment normal.       No results found for any visits on 01/17/24.     Assessment & Plan:   Problem List Items Addressed This Visit     Viral URI with cough - Primary   Flu negative. Cough and wheeze improved in office with albuterol  nebulizer administration.  Reassured patient that symptoms and exam findings are most consistent with a viral upper respiratory infection and explained lack of efficacy of antibiotics against viruses.  Discussed expected course and features suggestive of secondary bacterial infection.  Continue supportive care. Increase fluid intake with water  or electrolyte solution like pedialyte. Encouraged acetaminophen  as needed for fever/pain. Encouraged salt water  gargling, chloraseptic spray and throat lozenges. Encouraged OTC guaifenesin. Encouraged saline sinus flushes and/or neti with humidified air.  May use Benzonatate  PRN for cough.  Follow up if symptoms persist or worsen.      Relevant Medications   ipratropium-albuterol  (DUONEB) 0.5-2.5 (3) MG/3ML nebulizer solution 3 mL (Start on 01/17/2024  4:00 PM)       Meds ordered this encounter  Medications   DISCONTD:  HYDROcodone  bit-homatropine (HYCODAN) 5-1.5 MG/5ML syrup    Sig: Take 5 mLs by mouth every 8 (eight) hours as needed for cough.    Dispense:  120 mL    Refill:  0    Supervising Provider:   DUANNE LOWERS T [[id number]]   albuterol  (VENTOLIN  HFA) 108 (90 Base) MCG/ACT inhaler    Sig: Inhale 1-2 puffs into the lungs every 6 (six) hours as needed.  Dispense:  8 g    Refill:  0    Supervising Provider:   DUANNE LOWERS T [[id number]]   benzonatate  (TESSALON ) 200 MG capsule    Sig: Take 1 capsule (200 mg total) by mouth 2 (two) times daily as needed for cough.    Dispense:  20 capsule    Refill:  0    Supervising Provider:   DUANNE LOWERS T [[id number]]   ipratropium-albuterol  (DUONEB) 0.5-2.5 (3) MG/3ML nebulizer solution 3 mL    Return if symptoms worsen or fail to improve.  Jeoffrey GORMAN Barrio, FNP [facility]

## 2024-01-17 NOTE — Addendum Note (Signed)
 Addended by: KAYLA JEOFFREY RAMAN on: 01/17/2024 04:05 PM   Modules accepted: Orders

## 2024-01-18 ENCOUNTER — Encounter: Payer: Self-pay | Admitting: Internal Medicine

## 2024-01-18 ENCOUNTER — Ambulatory Visit: Payer: MEDICAID | Admitting: Internal Medicine

## 2024-01-18 VITALS — BP 108/76 | Ht 67.0 in | Wt 180.0 lb

## 2024-01-18 DIAGNOSIS — M25512 Pain in left shoulder: Secondary | ICD-10-CM | POA: Diagnosis not present

## 2024-01-18 DIAGNOSIS — S161XXA Strain of muscle, fascia and tendon at neck level, initial encounter: Secondary | ICD-10-CM

## 2024-01-18 MED ORDER — CYCLOBENZAPRINE HCL 5 MG PO TABS: 5.0000 mg | ORAL_TABLET | Freq: Every evening | ORAL | 0 refills | Status: AC | PRN

## 2024-01-18 NOTE — Therapy (Addendum)
 OUTPATIENT PHYSICAL THERAPY CERVICAL EVALUATION/DC   Patient Name: Abigail Rosario MRN: [id number] DOB:20-Jul-1980, 43 y.o., female Today's Date: 01/20/2024  END OF SESSION:  PT End of Session - 01/19/24 0856     Visit Number 1    Number of Visits 12    Date for Recertification  03/09/24    Authorization Type [employer]    Authorization - Visit Number 1    PT Start Time [phone]    PT Stop Time 0930    PT Time Calculation (min) 40 min    Activity Tolerance Patient tolerated treatment well    Behavior During Therapy WFL for tasks assessed/performed          Past Medical History:  Diagnosis Date   Anxiety    Blood transfusion without reported diagnosis    Depression    Drug abuse (HCC)    Hepatitis C    Past Surgical History:  Procedure Laterality Date   EYE SURGERY     FRACTURE SURGERY     RUPTURED GLOBE EXPLORATION AND REPAIR Left 01/18/2020   Procedure: REPAIR OF RUPTURED GLOBE;  Surgeon: Regenia Prentice Clack, MD;  Location: [facility] OR;  Service: Ophthalmology;  Laterality: Left;   TUBAL LIGATION     Patient Active Problem List   Diagnosis Date Noted   Viral URI with cough 01/17/2024   Hepatitis C virus infection without hepatic coma 06/15/2023   Class 1 obesity due to excess calories without serious comorbidity with body mass index (BMI) of 33.0 to 33.9 in adult 04/04/2023   Acute bronchitis 03/18/2023   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/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   BMI 33.0-33.9,adult 03/18/2023   Folliculitis of both axillae 11/10/2022   Routine screening for STI (sexually transmitted infection) 11/10/2022   Furuncle of left axilla 11/10/2022   Class 1 obesity due to excess calories without serious comorbidity with body mass index (BMI) of 30.0 to 30.9 in adult 09/29/2022   Bipolar 1 disorder, manic, mild (HCC) 05/13/2022   Anxiety and fearfulness of childhood and adolescence 05/13/2022   Depression 05/13/2022   OCD (obsessive compulsive disorder) 05/13/2022   PTSD (post-traumatic  stress disorder) 05/13/2022   Overweight (BMI 25.0-29.9) 05/13/2022   Physical exam, annual 03/23/2022   Nabothian cyst 03/23/2022   Ruptured globe, left eye 01/18/2020   Substance use disorder 01/10/2020   MDD (major depressive disorder) 01/09/2020   Bacteremia 02/10/2017   Abnormal urinalysis 02/07/2017   Calculus of gallbladder with acute cholecystitis without obstruction 02/07/2017   Cholecystitis, acute 02/07/2017   Elevated LDH 02/07/2017   Elevated bilirubin 02/07/2017   Elevated liver enzymes 02/07/2017   Epigastric pain 02/07/2017   Jaundice 02/07/2017   Anxiety and depression 02/07/2017    PCP: Kayla Jeoffrey RAMAN, FNP   REFERRING PROVIDER: Melvenia Manus BRAVO, MD   REFERRING DIAG: [phone] (ICD-10-CM) - Acute pain of left shoulder   THERAPY DIAG:  Chronic left shoulder pain  Abnormal posture  Cramp and spasm  Rationale for Evaluation and Treatment: Rehabilitation  ONSET DATE: approx 2 months  SUBJECTIVE:  SUBJECTIVE STATEMENT: Pt reports she is unsure how her L shoulder pain developed. All day she experiences aggravating pain which goes down the L arm to the wrist. She endorses intermittent N/T of the L arm. She notes she injured her L shoulder playing volleyball about 10 years ago and it improved. The pain she has at night is the worse. A TENs unit helps some with the pain.  Hand dominance: Right  PERTINENT HISTORY:  Tobacco use, see PMH above  PAIN:  Are you having pain? Yes: NPRS scale: 6-10/10. Current: 6/10  Pain location: L shoulder, upper trap, and arm Pain description: Throb, sharp Aggravating factors: Sleeping, driving, use of the L arm Relieving factors: TENs unit, ice pack, supporting the arm  PRECAUTIONS: None  RED FLAGS: None     WEIGHT BEARING  RESTRICTIONS: No  FALLS:  Has patient fallen in last 6 months? No  LIVING ENVIRONMENT: Lives with: lives alone Lives in: House/apartment Able to access home  OCCUPATION: Not working, applied for disability  PLOF: Independent  PATIENT GOALS: Less pain  NEXT MD VISIT: Pt to call and schedule as needed  OBJECTIVE:  Note: Objective measures were completed at Evaluation unless otherwise noted.  DIAGNOSTIC FINDINGS:  11/16/23: MSK US  Left Shoulder Complete MSK u/s left shoulder: Biceps tendon: Normal appearance Pec major tendon: Normal appearance Subscapularis: Normal appearance AC joint: Normal appearance Infraspinatus: Normal appearance Supraspinatus: There is increased hypoechogenicity of the subacromial-subdeltoid bursa, no tear identified Posterior glenohumeral joint: Normal appearance   Impression: Subacromial bursitis.  No RTC tears identified.  PATIENT SURVEYS:  Quick Dash: 76% Minimally Clinically Important Difference (MCID): 15-20 points  COGNITION: Overall cognitive status: Within functional limits for tasks assessed  SENSATION: WFL pt reports occasional N/T of 3rd-5th fingers  POSTURE: rounded shoulders, forward head, and posterior pelvic tilt  PALPATION: TTP to the L scalenes, upper trap, levator, interscapular muscles   CERVICAL ROM:  WFLs- Did not significantly reproduce symptoms Active ROM A/PROM (deg) eval  Flexion   Extension   Right lateral flexion   Left lateral flexion   Right rotation   Left rotation    (Blank rows = not tested)  UPPER EXTREMITY ROM:   WFLs and equal to the R Active ROM Right eval Left eval  Shoulder flexion    Shoulder extension    Shoulder abduction    Shoulder adduction    Shoulder extension    Shoulder internal rotation    Shoulder external rotation    Elbow flexion    Elbow extension    Wrist flexion    Wrist extension    Wrist ulnar deviation    Wrist radial deviation    Wrist pronation    Wrist  supination     (Blank rows = not tested)  UPPER EXTREMITY MMT: No overt weakness and without significant reproduction of pain MMT Right eval Left eval  Shoulder flexion    Shoulder extension    Shoulder abduction    Shoulder adduction    Shoulder extension    Shoulder internal rotation    Shoulder external rotation    Middle trapezius    Lower trapezius    Elbow flexion    Elbow extension    Wrist flexion    Wrist extension    Wrist ulnar deviation    Wrist radial deviation    Wrist pronation    Wrist supination    Grip strength 46,43= 44.5 40,40= 40   (Blank rows = not tested)  SHOULDER SPECIAL TESTS:  Hawkins-Kennedy, empty can, full can, hornblower tests were all negative Adson's test: positive   TREATMENT DATE: [facility] Adult PT Treatment:                                                DATE: 01/19/24 Therapeutic Exercise: Developed, instructed in, and pt completed therex as noted in HEP  Self Care: Eval findings and purpose of PT Sleeping positions and support for sleep hygiene                                                                                                                                 PATIENT EDUCATION:  Education details: Eval findings, POC, HEP, self care Person educated: Patient Education method: Explanation, Demonstration, Tactile cues, Verbal cues, and Handouts Education comprehension: verbalized understanding, returned demonstration, verbal cues required, and tactile cues required  HOME EXERCISE PROGRAM: Access Code: [email or web address] URL: [email or web address] Date: 01/19/2024 Prepared by: Dasie Daft  Exercises - Seated Scalenes Stretch (Mirrored)  - 2 x daily - 7 x weekly - 1 sets - 5 reps - 10 hold - Standing Shoulder Horizontal Abduction with Resistance  - 1 x daily - 7 x weekly - 3 sets - 10 reps - 2 hold  ASSESSMENT:  CLINICAL IMPRESSION: Patient is a 43 y.o. female who was seen today for physical therapy evaluation and  treatment for M25.512 (ICD-10-CM) - Acute pain of left shoulder. Pt presents with signs and symptoms most consistent with TOS with increased muscle tension of the L shoulder girdle muscles. Provided an initial HEP and self care ed. Pt will benefit from skilled PT 2w6 to address impairments to optimize L shoulder/UE function with less pain.   OBJECTIVE IMPAIRMENTS: decreased activity tolerance, increased muscle spasms, impaired UE functional use, and pain.   ACTIVITY LIMITATIONS: carrying, lifting, sleeping, bathing, dressing, and caring for others  PARTICIPATION LIMITATIONS: meal prep, cleaning, laundry, and driving  PERSONAL FACTORS: Past/current experiences are also affecting patient's functional outcome.   REHAB POTENTIAL: Good  CLINICAL DECISION MAKING: Evolving/moderate complexity  EVALUATION COMPLEXITY: Moderate   GOALS:  SHORT TERM GOALS: Target date: 02/10/24  Pt will be Ind in an initial HEP  Baseline: started Goal status: INITIAL  2.  Pt will report 25% improvement in her L shoulder pain for improved function and QOL Baseline: 6-10/10 Goal status: INITIAL   LONG TERM GOALS: Target date: 03/09/24  Pt will be Ind in a final HEP to maintain achieved LOF  Baseline: started Goal status: INITIAL  2.  Pt will report 75% improvement in her L shoulder pain for improved function and QOL Baseline: 6-10/10 Goal status: INITIAL  3.  Pt will report improved quality of sleep Baseline:  Goal status: INITIAL  4.  Pt's Quick Dash score will improve by the MCID to 63% or better as  indication of improved function  Baseline: 76% Goal status: INITIAL   PLAN:  PT FREQUENCY: 2x/week  PT DURATION: 6 weeks  PLANNED INTERVENTIONS: 97164- PT Re-evaluation, 97110-Therapeutic exercises, 97530- Therapeutic activity, [id number]- Neuromuscular re-education, 97535- Self Care, 02859- Manual therapy, G0283- Electrical stimulation (unattended), 20560 (1-2 muscles), 20561 (3+ muscles)- Dry  Needling, Patient/Family education, Joint mobilization, Spinal mobilization, Cryotherapy, and Moist heat  PLAN FOR NEXT SESSION: Review FOTO; assess response to HEP; progress therex as indicated; use of modalities, manual therapy; and TPDN as indicated.   Cherrelle Plante MS, PT 01/20/24 8:41 AM  For all possible CPT codes, reference the Planned Interventions line above.     Check all conditions that are expected to impact treatment: {Conditions expected to impact treatment:Musculoskeletal disorders   If treatment provided at initial evaluation, no treatment charged due to lack of authorization.   PHYSICAL THERAPY DISCHARGE SUMMARY  Visits from Start of Care: 1  Current functional level related to goals / functional outcomes: Pt called stating she was better.   Remaining deficits: NA   Education / Equipment: HEP   Patient agrees to discharge. Patient goals were partially met. Patient is being discharged due to Pt called stating she was better.

## 2024-01-18 NOTE — Progress Notes (Cosign Needed)
 Established Patient Office Visit  PCP: Kayla Jeoffrey RAMAN, FNP  Patient is a 43 y.o. female here for follow-up of left shoulder pain.  Last evaluated by me on 10/8 at which time she reported that her pain had improved for close to 2 weeks but then gradually returned.  She endorsed pain with overhead activity as well as night pain.  I recommended formal physical therapy, nitroglycerin patches, and meloxicam for pain relief.  6-week follow-up was arranged for reassessment.  Today she states that her pain is improving but she still experiences significant discomfort at night.  She points to the cervical aspect of the left trapezius when asked where her pain is located.  Pain radiates to the left arm.  It is worse with extension of the neck.  She denies discomfort with overhead activity.  She tried using nitroglycerin patches but experienced headaches.  Meloxicam is effective for as needed pain relief.  She will start physical therapy tomorrow.   Past Medical History:  Diagnosis Date   Anxiety    Blood transfusion without reported diagnosis    Depression    Drug abuse (HCC)    Hepatitis C     Current Outpatient Medications on File Prior to Visit  Medication Sig Dispense Refill   albuterol  (VENTOLIN  HFA) 108 (90 Base) MCG/ACT inhaler Inhale 1-2 puffs into the lungs every 6 (six) hours as needed. 8 g 0   benzonatate  (TESSALON ) 200 MG capsule Take 1 capsule (200 mg total) by mouth 2 (two) times daily as needed for cough. 20 capsule 0   cyclobenzaprine  (FLEXERIL ) 5 MG tablet Take 1 tablet (5 mg total) by mouth 3 (three) times daily as needed for muscle spasms. At night time for TMJ pain (can cause sedation). 30 tablet 0   gabapentin  (NEURONTIN ) 100 MG capsule Take 2 capsules (200 mg total) by mouth 3 (three) times daily. 180 capsule 0   levothyroxine  (SYNTHROID ) 25 MCG tablet TAKE 1 TABLET(25 MCG) BY MOUTH DAILY 90 tablet 0   meloxicam (MOBIC) 15 MG tablet Take 1 tablet (15 mg total) by mouth daily.  30 tablet 0   methocarbamol  (ROBAXIN ) 500 MG tablet Take 1 tablet (500 mg total) by mouth 2 (two) times daily. 20 tablet 0   nicotine  (NICODERM CQ  - DOSED IN MG/24 HOURS) 14 mg/24hr patch Place 1 patch (14 mg total) onto the skin daily. 14 patch 0   nitroGLYCERIN (NITRODUR - DOSED IN MG/24 HR) 0.2 mg/hr patch Use 1/4 patch daily to the affected area. 30 patch 1   prazosin (MINIPRESS) 1 MG capsule Take 1 mg by mouth at bedtime.     QUEtiapine  (SEROQUEL ) 50 MG tablet Take 1 tablet (50 mg total) by mouth at bedtime. 30 tablet 0   sertraline  (ZOLOFT ) 50 MG tablet Take 1 tablet (50 mg total) by mouth daily. 30 tablet 3   Current Facility-Administered Medications on File Prior to Visit  Medication Dose Route Frequency Provider Last Rate Last Admin   ipratropium-albuterol  (DUONEB) 0.5-2.5 (3) MG/3ML nebulizer solution 3 mL  3 mL Nebulization Once         Past Surgical History:  Procedure Laterality Date   EYE SURGERY     FRACTURE SURGERY     RUPTURED GLOBE EXPLORATION AND REPAIR Left 01/18/2020   Procedure: REPAIR OF RUPTURED GLOBE;  Surgeon: Regenia Prentice Clack, MD;  Location: [facility] OR;  Service: Ophthalmology;  Laterality: Left;   TUBAL LIGATION      Allergies  Allergen Reactions   Septra [  Sulfamethoxazole-Trimethoprim] Hives and Rash    BP 108/76   Ht 5' 7 (1.702 m)   Wt 180 lb (81.6 kg)   LMP 01/08/2024 (Approximate)   BMI 28.19 kg/m       No data to display              No data to display              Objective:  Physical Exam:  Gen: NAD, comfortable in exam room  Left shoulder No swelling, ecchymoses.  No gross deformity. TTP along the left trapezius FROM with active abduction and forward elevation ER to 75 degrees, IR to L1 Negative Hawkins and Neer's Negative speeds Strength 5/5 with empty can and resisted internal/external rotation. NV intact distally.   Neck No gross deformity, swelling, bruising. TTP along the left trapezius.  No midline/bony  TTP. FROM but has pain with extension BUE strength 5/5.   Sensation intact to light touch.   Negative spurlings. NV intact distal BUEs.   Assessment and Plan:  Left shoulder pain At this point her pain is localized to the cervical aspect of the left trapezius, most consistent with a strain.  She has full range of motion of the left shoulder.  No pain is elicited with empty can or Hawkins testing.  She will start physical therapy tomorrow, which I believe will be quite beneficial.  She may benefit from dry needling or acupuncture.  Regarding her night pain, I have prescribed low-dose cyclobenzaprine  for as needed use.  She was counseled on avoidance of driving or operating heavy machinery after taking it due to side effects of drowsiness.  Continue as needed use of meloxicam .  She will follow-up in 6 weeks if pain does not improve.  Manus FORBES Fireman, MD

## 2024-01-18 NOTE — Patient Instructions (Signed)
 It was a pleasure to see you today.  Thank you for giving us  the opportunity to be involved in your care.  Below is a brief recap of your visit and next steps.  We will plan to see you again in 6 weeks if needed.  Summary Your pain seems to be localized to your trapezius at this point. I think physical therapy will be quite helpful and am gladto see that your start tomorrow. I added a low dose of muscle relaxer to take at night if needed. I recommend that you avoid driving or operating heavy machinery after taking the medication as it may make you drowsy. Follow up in 6 weeks if pain has not improved.

## 2024-01-19 ENCOUNTER — Ambulatory Visit: Payer: MEDICAID | Attending: Family Medicine

## 2024-01-19 ENCOUNTER — Other Ambulatory Visit: Payer: Self-pay

## 2024-01-19 DIAGNOSIS — R293 Abnormal posture: Secondary | ICD-10-CM | POA: Insufficient documentation

## 2024-01-19 DIAGNOSIS — G8929 Other chronic pain: Secondary | ICD-10-CM | POA: Diagnosis present

## 2024-01-19 DIAGNOSIS — M25512 Pain in left shoulder: Secondary | ICD-10-CM | POA: Insufficient documentation

## 2024-01-19 DIAGNOSIS — R252 Cramp and spasm: Secondary | ICD-10-CM | POA: Diagnosis present

## 2024-01-30 NOTE — Therapy (Incomplete)
 OUTPATIENT PHYSICAL THERAPY CERVICAL TREATMENT Patient Name: Abigail Rosario MRN: [id number] DOB:[birth year], 43 y.o., female Today's Date: 01/30/2024  END OF SESSION:    Past Medical History:  Diagnosis Date   Anxiety    Blood transfusion without reported diagnosis    Depression    Drug abuse (HCC)    Hepatitis C    Past Surgical History:  Procedure Laterality Date   EYE SURGERY     FRACTURE SURGERY     RUPTURED GLOBE EXPLORATION AND REPAIR Left 01/18/2020   Procedure: REPAIR OF RUPTURED GLOBE;  Surgeon: Regenia Prentice Clack, MD;  Location: [facility] OR;  Service: Ophthalmology;  Laterality: Left;   TUBAL LIGATION     Patient Active Problem List   Diagnosis Date Noted   Viral URI with cough 01/17/2024   Hepatitis C virus infection without hepatic coma 06/15/2023   Class 1 obesity due to excess calories without serious comorbidity with body mass index (BMI) of 33.0 to 33.9 in adult 04/04/2023   Acute bronchitis 03/18/2023   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/18/2023   BMI 33.0-33.9,adult 03/18/2023   Folliculitis of both axillae 11/10/2022   Routine screening for STI (sexually transmitted infection) 11/10/2022   Furuncle of left axilla 11/10/2022   Class 1 obesity due to excess calories without serious comorbidity with body mass index (BMI) of 30.0 to 30.9 in adult 09/29/2022   Bipolar 1 disorder, manic, mild (HCC) 05/13/2022   Anxiety and fearfulness of childhood and adolescence 05/13/2022   Depression 05/13/2022   OCD (obsessive compulsive disorder) 05/13/2022   PTSD (post-traumatic stress disorder) 05/13/2022   Overweight (BMI 25.0-29.9) 05/13/2022   Physical exam, annual 03/23/2022   Nabothian cyst 03/23/2022   Ruptured globe, left eye 01/18/2020   Substance use disorder 01/10/2020   MDD (major depressive disorder) 01/09/2020   Bacteremia 02/10/2017   Abnormal urinalysis 02/07/2017   Calculus of gallbladder with acute cholecystitis without obstruction 02/07/2017   Cholecystitis, acute  02/07/2017   Elevated LDH 02/07/2017   Elevated bilirubin 02/07/2017   Elevated liver enzymes 02/07/2017   Epigastric pain 02/07/2017   Jaundice 02/07/2017   Anxiety and depression 02/07/2017    PCP: Kayla Jeoffrey RAMAN, FNP   REFERRING PROVIDER: Melvenia Manus BRAVO, MD   REFERRING DIAG: [phone] (ICD-10-CM) - Acute pain of left shoulder   THERAPY DIAG:  No diagnosis found.  Rationale for Evaluation and Treatment: Rehabilitation  ONSET DATE: approx 2 months  SUBJECTIVE:  SUBJECTIVE STATEMENT:   EVAL: Pt reports she is unsure how her L shoulder pain developed. All day she experiences aggravating pain which goes down the L arm to the wrist. She endorses intermittent N/T of the L arm. She notes she injured her L shoulder playing volleyball about 10 years ago and it improved. The pain she has at night is the worse. A TENs unit helps some with the pain.  Hand dominance: Right  PERTINENT HISTORY:  Tobacco use, see PMH above  PAIN:  Are you having pain? Yes: NPRS scale: 6-10/10. Current: 6/10  Pain location: L shoulder, upper trap, and arm Pain description: Throb, sharp Aggravating factors: Sleeping, driving, use of the L arm Relieving factors: TENs unit, ice pack, supporting the arm  PRECAUTIONS: None  RED FLAGS: None     WEIGHT BEARING RESTRICTIONS: No  FALLS:  Has patient fallen in last 6 months? No  LIVING ENVIRONMENT: Lives with: lives alone Lives in: House/apartment Able to access home  OCCUPATION: Not working, applied for disability  PLOF: Independent  PATIENT GOALS: Less pain  NEXT MD VISIT: Pt to call and schedule as needed  OBJECTIVE:  Note: Objective measures were completed at Evaluation unless otherwise noted.  DIAGNOSTIC FINDINGS:  11/16/23: [facility] US  Left  Shoulder Complete [facility] u/s left shoulder: Biceps tendon: Normal appearance Pec major tendon: Normal appearance Subscapularis: Normal appearance AC joint: Normal appearance Infraspinatus: Normal appearance Supraspinatus: There is increased hypoechogenicity of the subacromial-subdeltoid bursa, no tear identified Posterior glenohumeral joint: Normal appearance   Impression: Subacromial bursitis.  No RTC tears identified.  PATIENT SURVEYS:  Quick Dash: 76% Minimally Clinically Important Difference (MCID): 15-20 points  COGNITION: Overall cognitive status: Within functional limits for tasks assessed  SENSATION: WFL pt reports occasional N/T of 3rd-5th fingers  POSTURE: rounded shoulders, forward head, and posterior pelvic tilt  PALPATION: TTP to the L scalenes, upper trap, levator, interscapular muscles   CERVICAL ROM:  WFLs- Did not significantly reproduce symptoms Active ROM A/PROM (deg) eval  Flexion   Extension   Right lateral flexion   Left lateral flexion   Right rotation   Left rotation    (Blank rows = not tested)  UPPER EXTREMITY ROM:   WFLs and equal to the R Active ROM Right eval Left eval  Shoulder flexion    Shoulder extension    Shoulder abduction    Shoulder adduction    Shoulder extension    Shoulder internal rotation    Shoulder external rotation    Elbow flexion    Elbow extension    Wrist flexion    Wrist extension    Wrist ulnar deviation    Wrist radial deviation    Wrist pronation    Wrist supination     (Blank rows = not tested)  UPPER EXTREMITY MMT: No overt weakness and without significant reproduction of pain MMT Right eval Left eval  Shoulder flexion    Shoulder extension    Shoulder abduction    Shoulder adduction    Shoulder extension    Shoulder internal rotation    Shoulder external rotation    Middle trapezius    Lower trapezius    Elbow flexion    Elbow extension    Wrist flexion    Wrist extension    Wrist  ulnar deviation    Wrist radial deviation    Wrist pronation    Wrist supination    Grip strength 46,43= 44.5 40,40= 40   (Blank rows = not tested)  SHOULDER SPECIAL TESTS:  Hawkins-Kennedy, empty can, full can, hornblower tests were all negative Adson's test: positive   TREATMENT DATE: [facility] Adult PT Treatment:                                                DATE: 01/31/24 Therapeutic Exercise: - Seated Scalenes Stretch (Mirrored)  - 2 x daily - 7 x weekly - 1 sets - 5 reps - 10 hold - Standing Shoulder Horizontal Abduction with Resistance  - 1 x daily - 7 x weekly - 3 sets - 10 reps - 2 hold Manual Therapy: *** Neuromuscular re-ed: *** Therapeutic Activity: *** Modalities: *** Self Care: ***  [facility] Adult PT Treatment:                                                DATE: 01/19/24 Therapeutic Exercise: Developed, instructed in, and pt completed therex as noted in HEP  Self Care: Eval findings and purpose of PT Sleeping positions and support for sleep hygiene                                                                                                                                 PATIENT EDUCATION:  Education details: Eval findings, POC, HEP, self care Person educated: Patient Education method: Explanation, Demonstration, Tactile cues, Verbal cues, and Handouts Education comprehension: verbalized understanding, returned demonstration, verbal cues required, and tactile cues required  HOME EXERCISE PROGRAM: Access Code: [email or web address] URL: [email or web address] Date: 01/19/2024 Prepared by: Dasie Daft  Exercises - Seated Scalenes Stretch (Mirrored)  - 2 x daily - 7 x weekly - 1 sets - 5 reps - 10 hold - Standing Shoulder Horizontal Abduction with Resistance  - 1 x daily - 7 x weekly - 3 sets - 10 reps - 2 hold  ASSESSMENT:  CLINICAL IMPRESSION:   EVAL: Patient is a 42 y.o. female who was seen today for physical therapy evaluation and treatment for  M25.512 (ICD-10-CM) - Acute pain of left shoulder. Pt presents with signs and symptoms most consistent with TOS with increased muscle tension of the L shoulder girdle muscles. Provided an initial HEP and self care ed. Pt will benefit from skilled PT 2w6 to address impairments to optimize L shoulder/UE function with less pain.   OBJECTIVE IMPAIRMENTS: decreased activity tolerance, increased muscle spasms, impaired UE functional use, and pain.   ACTIVITY LIMITATIONS: carrying, lifting, sleeping, bathing, dressing, and caring for others  PARTICIPATION LIMITATIONS: meal prep, cleaning, laundry, and driving  PERSONAL FACTORS: Past/current experiences are also affecting patient's functional outcome.   REHAB POTENTIAL: Good  CLINICAL DECISION MAKING: Evolving/moderate complexity  EVALUATION COMPLEXITY: Moderate   GOALS:  SHORT  TERM GOALS: Target date: 02/10/24  Pt will be Ind in an initial HEP  Baseline: started Goal status: INITIAL  2.  Pt will report 25% improvement in her L shoulder pain for improved function and QOL Baseline: 6-10/10 Goal status: INITIAL   LONG TERM GOALS: Target date: 03/09/24  Pt will be Ind in a final HEP to maintain achieved LOF  Baseline: started Goal status: INITIAL  2.  Pt will report 75% improvement in her L shoulder pain for improved function and QOL Baseline: 6-10/10 Goal status: INITIAL  3.  Pt will report improved quality of sleep Baseline:  Goal status: INITIAL  4.  Pt's Quick Dash score will improve by the MCID to 63% or better as indication of improved function  Baseline: 76% Goal status: INITIAL   PLAN:  PT FREQUENCY: 2x/week  PT DURATION: 6 weeks  PLANNED INTERVENTIONS: 97164- PT Re-evaluation, 97110-Therapeutic exercises, 97530- Therapeutic activity, 97112- Neuromuscular re-education, 97535- Self Care, [id number]- Manual therapy, [id number]- Electrical stimulation (unattended), 20560 (1-2 muscles), 20561 (3+ muscles)- Dry Needling,  Patient/Family education, Joint mobilization, Spinal mobilization, Cryotherapy, and Moist heat  PLAN FOR NEXT SESSION: Review FOTO; assess response to HEP; progress therex as indicated; use of modalities, manual therapy; and TPDN as indicated.   Harsha Yusko MS, PT 01/30/24 8:51 PM  For all possible CPT codes, reference the Planned Interventions line above.     Check all conditions that are expected to impact treatment: {Conditions expected to impact treatment:Musculoskeletal disorders   If treatment provided at initial evaluation, no treatment charged due to lack of authorization.

## 2024-01-31 ENCOUNTER — Ambulatory Visit: Payer: MEDICAID

## 2024-02-02 ENCOUNTER — Ambulatory Visit: Payer: MEDICAID | Admitting: Physical Therapy

## 2024-02-07 ENCOUNTER — Ambulatory Visit: Payer: MEDICAID

## 2024-02-08 ENCOUNTER — Ambulatory Visit: Admitting: Family Medicine

## 2024-02-09 ENCOUNTER — Ambulatory Visit: Admitting: Family Medicine

## 2024-02-09 ENCOUNTER — Ambulatory Visit: Payer: MEDICAID

## 2024-02-14 ENCOUNTER — Ambulatory Visit: Payer: MEDICAID

## 2024-02-16 ENCOUNTER — Ambulatory Visit
